# Patient Record
Sex: Female | Born: 1950 | Race: White | Hispanic: No | Marital: Married | Smoking: Never smoker
Health system: Southern US, Community
[De-identification: ages and names within clinical notes are randomized; demographics above are authoritative.]

## PROBLEM LIST (undated history)

## (undated) DIAGNOSIS — M069 Rheumatoid arthritis, unspecified: Secondary | ICD-10-CM

## (undated) DIAGNOSIS — E785 Hyperlipidemia, unspecified: Secondary | ICD-10-CM

## (undated) DIAGNOSIS — I10 Essential (primary) hypertension: Secondary | ICD-10-CM

## (undated) HISTORY — PX: TUBAL LIGATION: SHX77

## (undated) HISTORY — DX: Essential (primary) hypertension: I10

## (undated) HISTORY — DX: Hyperlipidemia, unspecified: E78.5

## (undated) HISTORY — PX: ABDOMINAL HYSTERECTOMY: SHX81

## (undated) HISTORY — PX: AUGMENTATION MAMMAPLASTY: SUR837

## (undated) HISTORY — DX: Rheumatoid arthritis, unspecified: M06.9

---

## 2017-09-30 DIAGNOSIS — Z0000 Encounter for general adult medical examination without abnormal findings: Secondary | ICD-10-CM | POA: Diagnosis not present

## 2017-09-30 DIAGNOSIS — Z79899 Other long term (current) drug therapy: Secondary | ICD-10-CM | POA: Diagnosis not present

## 2017-09-30 DIAGNOSIS — E7800 Pure hypercholesterolemia, unspecified: Secondary | ICD-10-CM | POA: Diagnosis not present

## 2018-03-31 DIAGNOSIS — Z79899 Other long term (current) drug therapy: Secondary | ICD-10-CM | POA: Diagnosis not present

## 2018-03-31 DIAGNOSIS — E7800 Pure hypercholesterolemia, unspecified: Secondary | ICD-10-CM | POA: Diagnosis not present

## 2018-04-02 DIAGNOSIS — Z79899 Other long term (current) drug therapy: Secondary | ICD-10-CM | POA: Diagnosis not present

## 2018-04-02 DIAGNOSIS — I10 Essential (primary) hypertension: Secondary | ICD-10-CM | POA: Diagnosis not present

## 2018-04-02 DIAGNOSIS — Z23 Encounter for immunization: Secondary | ICD-10-CM | POA: Diagnosis not present

## 2018-04-02 DIAGNOSIS — M25552 Pain in left hip: Secondary | ICD-10-CM | POA: Diagnosis not present

## 2018-04-02 DIAGNOSIS — E7800 Pure hypercholesterolemia, unspecified: Secondary | ICD-10-CM | POA: Diagnosis not present

## 2018-12-17 ENCOUNTER — Other Ambulatory Visit: Payer: Self-pay

## 2018-12-17 DIAGNOSIS — Z20822 Contact with and (suspected) exposure to covid-19: Secondary | ICD-10-CM

## 2018-12-17 LAB — NOVEL CORONAVIRUS, NAA: SARS-CoV-2, NAA: NOT DETECTED

## 2018-12-31 ENCOUNTER — Other Ambulatory Visit: Payer: Self-pay

## 2018-12-31 DIAGNOSIS — Z20822 Contact with and (suspected) exposure to covid-19: Secondary | ICD-10-CM

## 2019-01-01 LAB — NOVEL CORONAVIRUS, NAA: SARS-CoV-2, NAA: NOT DETECTED

## 2019-01-01 LAB — SPECIMEN STATUS REPORT

## 2019-01-27 DIAGNOSIS — M659 Synovitis and tenosynovitis, unspecified: Secondary | ICD-10-CM | POA: Diagnosis not present

## 2019-01-27 DIAGNOSIS — G5603 Carpal tunnel syndrome, bilateral upper limbs: Secondary | ICD-10-CM | POA: Diagnosis not present

## 2019-02-12 DIAGNOSIS — M25541 Pain in joints of right hand: Secondary | ICD-10-CM | POA: Diagnosis not present

## 2019-02-12 DIAGNOSIS — I10 Essential (primary) hypertension: Secondary | ICD-10-CM | POA: Diagnosis not present

## 2019-02-12 DIAGNOSIS — R609 Edema, unspecified: Secondary | ICD-10-CM | POA: Diagnosis not present

## 2019-02-12 DIAGNOSIS — Z23 Encounter for immunization: Secondary | ICD-10-CM | POA: Diagnosis not present

## 2019-02-12 DIAGNOSIS — E782 Mixed hyperlipidemia: Secondary | ICD-10-CM | POA: Diagnosis not present

## 2019-02-17 ENCOUNTER — Other Ambulatory Visit (HOSPITAL_COMMUNITY): Payer: Self-pay | Admitting: Internal Medicine

## 2019-02-17 DIAGNOSIS — Z1231 Encounter for screening mammogram for malignant neoplasm of breast: Secondary | ICD-10-CM

## 2019-02-26 ENCOUNTER — Encounter: Payer: Self-pay | Admitting: *Deleted

## 2019-02-26 ENCOUNTER — Ambulatory Visit (HOSPITAL_COMMUNITY): Payer: Self-pay

## 2019-03-02 DIAGNOSIS — M25542 Pain in joints of left hand: Secondary | ICD-10-CM | POA: Diagnosis not present

## 2019-03-02 DIAGNOSIS — R609 Edema, unspecified: Secondary | ICD-10-CM | POA: Diagnosis not present

## 2019-03-02 DIAGNOSIS — M25541 Pain in joints of right hand: Secondary | ICD-10-CM | POA: Diagnosis not present

## 2019-03-05 ENCOUNTER — Encounter (HOSPITAL_COMMUNITY): Payer: Self-pay

## 2019-03-05 ENCOUNTER — Other Ambulatory Visit: Payer: Self-pay

## 2019-03-05 ENCOUNTER — Ambulatory Visit (HOSPITAL_COMMUNITY)
Admission: RE | Admit: 2019-03-05 | Discharge: 2019-03-05 | Disposition: A | Discharge status: Home / Self Care | Payer: Medicare Other | Source: Ambulatory Visit | Attending: Internal Medicine | Admitting: Internal Medicine

## 2019-03-05 DIAGNOSIS — Z1231 Encounter for screening mammogram for malignant neoplasm of breast: Secondary | ICD-10-CM

## 2019-03-10 DIAGNOSIS — R5382 Chronic fatigue, unspecified: Secondary | ICD-10-CM | POA: Diagnosis not present

## 2019-03-10 DIAGNOSIS — M2550 Pain in unspecified joint: Secondary | ICD-10-CM | POA: Diagnosis not present

## 2019-03-24 DIAGNOSIS — M0609 Rheumatoid arthritis without rheumatoid factor, multiple sites: Secondary | ICD-10-CM | POA: Diagnosis not present

## 2019-03-24 DIAGNOSIS — E790 Hyperuricemia without signs of inflammatory arthritis and tophaceous disease: Secondary | ICD-10-CM | POA: Diagnosis not present

## 2019-03-24 DIAGNOSIS — M2550 Pain in unspecified joint: Secondary | ICD-10-CM | POA: Diagnosis not present

## 2019-03-24 DIAGNOSIS — R5382 Chronic fatigue, unspecified: Secondary | ICD-10-CM | POA: Diagnosis not present

## 2019-03-29 ENCOUNTER — Ambulatory Visit (INDEPENDENT_AMBULATORY_CARE_PROVIDER_SITE_OTHER): Payer: Self-pay | Admitting: *Deleted

## 2019-03-29 ENCOUNTER — Other Ambulatory Visit: Payer: Self-pay

## 2019-03-29 DIAGNOSIS — Z1211 Encounter for screening for malignant neoplasm of colon: Secondary | ICD-10-CM

## 2019-03-29 MED ORDER — PEG 3350-KCL-NA BICARB-NACL 420 G PO SOLR: 4000.0000 mL | Freq: Once | ORAL | 0 refills | Status: AC

## 2019-03-29 NOTE — Progress Notes (Signed)
Gastroenterology Pre-Procedure Review  Request Date: 03/29/2019 Requesting Physician: Dr. Wende Neighbors, Last TCS 5 years ago done in [place], [place], pt thinks she had polyps but none prior to then  PATIENT REVIEW QUESTIONS: The patient responded to the following health history questions as indicated:    1. Diabetes Melitis: no 2. Joint replacements in the past 12 months: no 3. Major health problems in the past 3 months: yes, RA, rheumatologist is managing her care 4. Has an artificial valve or MVP: no 5. Has a defibrillator: no 6. Has been advised in past to take antibiotics in advance of a procedure like teeth cleaning: no 7. Family history of colon cancer: yes, mother: age 36  8. Alcohol Use: yes, 1 glass of wine daily 9. Illicit drug Use: no 10. History of sleep apnea: no  11. History of coronary artery or other vascular stents placed within the last 12 months: no 12. History of any prior anesthesia complications: no 13. There is no height or weight on file to calculate BMI.ht: 5'6 wt: 170 lbs    MEDICATIONS & ALLERGIES:    Patient reports the following regarding taking any blood thinners:   Plavix? no Aspirin? no Coumadin? no Brilinta? no Xarelto? no Eliquis? no Pradaxa? no Savaysa? no Effient? no  Patient confirms/reports the following medications:  Current Outpatient Medications  Medication Sig Dispense Refill  . atorvastatin (LIPITOR) 10 MG tablet Take 10 mg by mouth daily.    . folic acid (FOLVITE) 1 MG tablet Take 1 mg by mouth daily.    . furosemide (LASIX) 20 MG tablet Take 20 mg by mouth daily.    Marland Kitchen losartan (COZAAR) 25 MG tablet Take 15 mg by mouth daily. Pt takes 15 mg daily.    . Methotrexate, PF, 25 MG/0.5ML SOAJ Inject into the skin once a week.    . nabumetone (RELAFEN) 750 MG tablet Take 750 mg by mouth 2 (two) times daily.    . Turmeric (QC TUMERIC COMPLEX PO) Take by mouth daily.    Marland Kitchen VITAMIN D PO Take by mouth daily.    Marland Kitchen VITAMIN E PO Take by mouth  daily.     No current facility-administered medications for this visit.     Patient confirms/reports the following allergies:  Allergies  Allergen Reactions  . Aspirin Other (See Comments)    Upset stomach  . Codeine Other (See Comments)    Nervous    No orders of the defined types were placed in this encounter.   AUTHORIZATION INFORMATION Primary Insurance: [employer] Medicare,  ID #: [id number],  Group #: [id number] Pre-Cert / Auth required: No, not required  SCHEDULE INFORMATION: Procedure has been scheduled as follows:  Date: 07/09/2019, Time: 10:30 Location: [facility] with Dr. Oneida Alar  This Gastroenterology Pre-Precedure Review Form is being routed to the following provider(s): Neil Crouch, PA-C

## 2019-03-29 NOTE — Patient Instructions (Signed)
SHRESHTA MEDLEY   07/08/1950 MRN: [id number]    Procedure Date: 07/09/2019 Time to register: 9:30 am Place to register: [facility] Procedure Time: 10:30 am Scheduled provider: Dr. Oneida Alar  PREPARATION FOR COLONOSCOPY WITH TRI-LYTE SPLIT PREP  Please notify us immediately if you are diabetic, take iron supplements, or if you are on Coumadin or any other blood thinners.   You will need to purchase 1 fleet enema and 1 box of Bisacodyl 28m tablets.   1 DAY BEFORE PROCEDURE:  DATE: 07/08/2019   DAY: Thursday Continue clear liquids the entire day - NO SOLID FOOD.   At 2:00 pm:  Take 2 Bisacodyl tablets.   At 4:00pm:  Start drinking your solution. Make sure you mix well per instructions on the bottle. Try to drink 1 (one) 8 ounce glass every 10-15 minutes until you have consumed HALF the jug. You should complete by 6:00pm.You must keep the left over solution refrigerated until completed next day.  Continue clear liquids. You must drink plenty of clear liquids to prevent dehyration and kidney failure.     DAY OF PROCEDURE:   DATE: 07/09/2019   DAY: Friday If you take medications for your heart, blood pressure or breathing, you may take these medications.   Five hours before your procedure time @ 5:30 am:  Finish remaining amout of bowel prep, drinking 1 (one) 8 ounce glass every 10-15 minutes until complete. You have two hours to consume remaining prep.   Three hours before your procedure time @ 7:30 am:  Nothing by mouth.   At least one hour before going to the hospital:  Give yourself one Fleet enema. You may take your morning medications with sip of water unless we have instructed otherwise.      Please see below for Dietary Information.  CLEAR LIQUIDS INCLUDE:  Water Jello (NOT red in color)   Ice Popsicles (NOT red in color)   Tea (sugar ok, no milk/cream) Powdered fruit flavored drinks  Coffee (sugar ok, no milk/cream) Gatorade/ Lemonade/ Kool-Aid  (NOT red in  color)   Juice: apple, white grape, white cranberry Soft drinks  Clear bullion, consomme, broth (fat free beef/chicken/vegetable)  Carbonated beverages (any kind)  Strained chicken noodle soup Hard Candy   Remember: Clear liquids are liquids that will allow you to see your fingers on the other side of a clear glass. Be sure liquids are NOT red in color, and not cloudy, but CLEAR.  DO NOT EAT OR DRINK ANY OF THE FOLLOWING:  Dairy products of any kind   Cranberry juice Tomato juice / V8 juice   Grapefruit juice Orange juice     Red grape juice  Do not eat any solid foods, including such foods as: cereal, oatmeal, yogurt, fruits, vegetables, creamed soups, eggs, bread, crackers, pureed foods in a blender, etc.   HELPFUL HINTS FOR DRINKING PREP SOLUTION:   Make sure prep is extremely cold. Mix and refrigerate the the morning of the prep. You may also put in the freezer.   You may try mixing some Crystal Light or Country Time Lemonade if you prefer. Mix in small amounts; add more if necessary.  Try drinking through a straw  Rinse mouth with water or a mouthwash between glasses, to remove after-taste.  Try sipping on a cold beverage /ice/ popsicles between glasses of prep.  Place a piece of sugar-free hard candy in mouth between glasses.  If you become nauseated, try consuming smaller amounts, or stretch out the  time between glasses. Stop for 30-60 minutes, then slowly start back drinking.        OTHER INSTRUCTIONS  You will need a responsible adult at least 68 years of age to accompany you and drive you home. This person must remain in the waiting room during your procedure. The hospital will cancel your procedure if you do not have a responsible adult with you.   1. Wear loose fitting clothing that is easily removed. 2. Leave jewelry and other valuables at home.  3. Remove all body piercing jewelry and leave at home. 4. Total time from sign-in until discharge is approximately  2-3 hours. 5. You should go home directly after your procedure and rest. You can resume normal activities the day after your procedure. 6. The day of your procedure you should not:  Drive  Make legal decisions  Operate machinery  Drink alcohol  Return to work   You may call the office (Dept: [phone]) before 5:00pm, or page the doctor on call [phone]) after 5:00pm, for further instructions, if necessary.   Insurance Information YOU WILL NEED TO CHECK WITH YOUR INSURANCE COMPANY FOR THE BENEFITS OF COVERAGE YOU HAVE FOR THIS PROCEDURE.  UNFORTUNATELY, NOT ALL INSURANCE COMPANIES HAVE BENEFITS TO COVER ALL OR PART OF THESE TYPES OF PROCEDURES.  IT IS YOUR RESPONSIBILITY TO CHECK YOUR BENEFITS, HOWEVER, WE WILL BE GLAD TO ASSIST YOU WITH ANY CODES YOUR INSURANCE COMPANY MAY NEED.    PLEASE NOTE THAT MOST INSURANCE COMPANIES WILL NOT COVER A SCREENING COLONOSCOPY FOR PEOPLE UNDER THE AGE OF 50  IF YOU HAVE BCBS INSURANCE, YOU MAY HAVE BENEFITS FOR A SCREENING COLONOSCOPY BUT IF POLYPS ARE FOUND THE DIAGNOSIS WILL CHANGE AND THEN YOU MAY HAVE A DEDUCTIBLE THAT WILL NEED TO BE MET. SO PLEASE MAKE SURE YOU CHECK YOUR BENEFITS FOR A SCREENING COLONOSCOPY AS WELL AS A DIAGNOSTIC COLONOSCOPY.

## 2019-03-29 NOTE — Addendum Note (Signed)
Addended by: Metro Kung on: 03/29/2019 04:32 PM   Modules accepted: Orders, SmartSet

## 2019-03-29 NOTE — Progress Notes (Signed)
Ok to schedule.

## 2019-03-30 ENCOUNTER — Telehealth: Payer: Self-pay | Admitting: *Deleted

## 2019-03-30 NOTE — Telephone Encounter (Addendum)
Pt called in and requested to cancel her procedure for 07/08/2018.  She said that she is undergoing treatment for her arthritis and just doesn't feel like doing a colonoscopy anytime soon.  Advised pt to call us back when ready to reschedule and if it is more than 6 months, to have PCP send another referral.  Pt voiced understanding.  Endo and PCP notified.

## 2019-05-03 DIAGNOSIS — M059 Rheumatoid arthritis with rheumatoid factor, unspecified: Secondary | ICD-10-CM | POA: Diagnosis not present

## 2019-05-03 DIAGNOSIS — L659 Nonscarring hair loss, unspecified: Secondary | ICD-10-CM | POA: Diagnosis not present

## 2019-05-03 DIAGNOSIS — R040 Epistaxis: Secondary | ICD-10-CM | POA: Diagnosis not present

## 2019-05-03 DIAGNOSIS — L309 Dermatitis, unspecified: Secondary | ICD-10-CM | POA: Diagnosis not present

## 2019-05-05 DIAGNOSIS — M0609 Rheumatoid arthritis without rheumatoid factor, multiple sites: Secondary | ICD-10-CM | POA: Diagnosis not present

## 2019-05-05 DIAGNOSIS — R5382 Chronic fatigue, unspecified: Secondary | ICD-10-CM | POA: Diagnosis not present

## 2019-05-05 DIAGNOSIS — M2550 Pain in unspecified joint: Secondary | ICD-10-CM | POA: Diagnosis not present

## 2019-06-08 DIAGNOSIS — M059 Rheumatoid arthritis with rheumatoid factor, unspecified: Secondary | ICD-10-CM | POA: Diagnosis not present

## 2019-06-08 DIAGNOSIS — L298 Other pruritus: Secondary | ICD-10-CM | POA: Diagnosis not present

## 2019-06-08 DIAGNOSIS — M792 Neuralgia and neuritis, unspecified: Secondary | ICD-10-CM | POA: Diagnosis not present

## 2019-06-23 ENCOUNTER — Ambulatory Visit: Payer: Medicare Other | Attending: Internal Medicine

## 2019-06-23 ENCOUNTER — Other Ambulatory Visit: Payer: Self-pay

## 2019-06-23 DIAGNOSIS — Z20822 Contact with and (suspected) exposure to covid-19: Secondary | ICD-10-CM

## 2019-06-24 LAB — NOVEL CORONAVIRUS, NAA: SARS-CoV-2, NAA: NOT DETECTED

## 2019-07-02 DIAGNOSIS — I10 Essential (primary) hypertension: Secondary | ICD-10-CM | POA: Diagnosis not present

## 2019-07-02 DIAGNOSIS — L298 Other pruritus: Secondary | ICD-10-CM | POA: Diagnosis not present

## 2019-07-02 DIAGNOSIS — E782 Mixed hyperlipidemia: Secondary | ICD-10-CM | POA: Diagnosis not present

## 2019-07-03 ENCOUNTER — Ambulatory Visit: Payer: Medicare Other | Attending: Internal Medicine

## 2019-07-03 ENCOUNTER — Other Ambulatory Visit: Payer: Self-pay

## 2019-07-03 DIAGNOSIS — Z23 Encounter for immunization: Secondary | ICD-10-CM

## 2019-07-03 NOTE — Progress Notes (Signed)
   Covid-19 Vaccination Clinic  Name:  DEONDRIA BERTON    MRN: [id number] DOB: [birth year]  07/03/2019  Ms. Beissel was observed post Covid-19 immunization for 15 minutes without incidence. She was provided with Vaccine Information Sheet and instruction to access the V-Safe system.   Ms. Siek was instructed to call 911 with any severe reactions post vaccine: Marland Kitchen Difficulty breathing  . Swelling of your face and throat  . A fast heartbeat  . A bad rash all over your body  . Dizziness and weakness    Immunizations Administered    Name Date Dose VIS Date Route   Moderna COVID-19 Vaccine 07/03/2019 12:00 PM 0.5 mL 04/27/2019 Intramuscular   Manufacturer: Moderna   Lot: [id number]   [id number]

## 2019-07-07 ENCOUNTER — Other Ambulatory Visit (HOSPITAL_COMMUNITY): Payer: Medicare Other

## 2019-07-09 ENCOUNTER — Encounter (HOSPITAL_COMMUNITY): Payer: Self-pay

## 2019-07-09 ENCOUNTER — Ambulatory Visit (HOSPITAL_COMMUNITY): Admit: 2019-07-09 | Payer: Medicare Other | Admitting: Gastroenterology

## 2019-07-09 DIAGNOSIS — Z0001 Encounter for general adult medical examination with abnormal findings: Secondary | ICD-10-CM | POA: Diagnosis not present

## 2019-07-09 DIAGNOSIS — L659 Nonscarring hair loss, unspecified: Secondary | ICD-10-CM | POA: Diagnosis not present

## 2019-07-09 DIAGNOSIS — R945 Abnormal results of liver function studies: Secondary | ICD-10-CM | POA: Diagnosis not present

## 2019-07-09 DIAGNOSIS — E790 Hyperuricemia without signs of inflammatory arthritis and tophaceous disease: Secondary | ICD-10-CM | POA: Diagnosis not present

## 2019-07-09 SURGERY — COLONOSCOPY
Anesthesia: Moderate Sedation

## 2019-08-03 ENCOUNTER — Ambulatory Visit: Payer: Medicare Other | Attending: Internal Medicine

## 2019-08-03 DIAGNOSIS — Z23 Encounter for immunization: Secondary | ICD-10-CM | POA: Insufficient documentation

## 2019-08-03 NOTE — Progress Notes (Signed)
   Covid-19 Vaccination Clinic  Name:  Karen Sims    MRN: [id number] DOB: March 04, 1951  08/03/2019  [name] was observed post Covid-19 immunization for 15 minutes without incident. She was provided with Vaccine Information Sheet and instruction to access the V-Safe system.   [name] was instructed to call 911 with any severe reactions post vaccine: Marland Kitchen Difficulty breathing  . Swelling of face and throat  . A fast heartbeat  . A bad rash all over body  . Dizziness and weakness   Immunizations Administered    Name Date Dose VIS Date Route   Moderna COVID-19 Vaccine 08/03/2019 12:01 PM 0.5 mL 04/27/2019 Intramuscular   Manufacturer: Moderna   Lot: [id number]   [id number]

## 2019-11-08 DIAGNOSIS — E782 Mixed hyperlipidemia: Secondary | ICD-10-CM | POA: Diagnosis not present

## 2019-11-08 DIAGNOSIS — E790 Hyperuricemia without signs of inflammatory arthritis and tophaceous disease: Secondary | ICD-10-CM | POA: Diagnosis not present

## 2019-11-08 DIAGNOSIS — L298 Other pruritus: Secondary | ICD-10-CM | POA: Diagnosis not present

## 2019-11-08 DIAGNOSIS — N3000 Acute cystitis without hematuria: Secondary | ICD-10-CM | POA: Diagnosis not present

## 2019-11-08 DIAGNOSIS — N390 Urinary tract infection, site not specified: Secondary | ICD-10-CM | POA: Diagnosis not present

## 2019-11-08 DIAGNOSIS — I10 Essential (primary) hypertension: Secondary | ICD-10-CM | POA: Diagnosis not present

## 2020-01-07 DIAGNOSIS — M25542 Pain in joints of left hand: Secondary | ICD-10-CM | POA: Diagnosis not present

## 2020-01-07 DIAGNOSIS — E790 Hyperuricemia without signs of inflammatory arthritis and tophaceous disease: Secondary | ICD-10-CM | POA: Diagnosis not present

## 2020-01-07 DIAGNOSIS — E559 Vitamin D deficiency, unspecified: Secondary | ICD-10-CM | POA: Diagnosis not present

## 2020-01-07 DIAGNOSIS — M25541 Pain in joints of right hand: Secondary | ICD-10-CM | POA: Diagnosis not present

## 2020-01-07 DIAGNOSIS — L659 Nonscarring hair loss, unspecified: Secondary | ICD-10-CM | POA: Diagnosis not present

## 2020-01-07 DIAGNOSIS — M059 Rheumatoid arthritis with rheumatoid factor, unspecified: Secondary | ICD-10-CM | POA: Diagnosis not present

## 2020-01-14 DIAGNOSIS — R7301 Impaired fasting glucose: Secondary | ICD-10-CM | POA: Diagnosis not present

## 2020-01-14 DIAGNOSIS — E782 Mixed hyperlipidemia: Secondary | ICD-10-CM | POA: Diagnosis not present

## 2020-01-14 DIAGNOSIS — Z23 Encounter for immunization: Secondary | ICD-10-CM | POA: Diagnosis not present

## 2020-01-14 DIAGNOSIS — R945 Abnormal results of liver function studies: Secondary | ICD-10-CM | POA: Diagnosis not present

## 2020-01-14 DIAGNOSIS — R609 Edema, unspecified: Secondary | ICD-10-CM | POA: Diagnosis not present

## 2020-01-18 ENCOUNTER — Other Ambulatory Visit (HOSPITAL_COMMUNITY): Payer: Self-pay | Admitting: Internal Medicine

## 2020-01-18 DIAGNOSIS — Z13820 Encounter for screening for osteoporosis: Secondary | ICD-10-CM

## 2020-02-21 DIAGNOSIS — M059 Rheumatoid arthritis with rheumatoid factor, unspecified: Secondary | ICD-10-CM | POA: Diagnosis not present

## 2020-02-21 DIAGNOSIS — E7849 Other hyperlipidemia: Secondary | ICD-10-CM | POA: Diagnosis not present

## 2020-02-21 DIAGNOSIS — I129 Hypertensive chronic kidney disease with stage 1 through stage 4 chronic kidney disease, or unspecified chronic kidney disease: Secondary | ICD-10-CM | POA: Diagnosis not present

## 2020-02-21 DIAGNOSIS — N183 Chronic kidney disease, stage 3 unspecified: Secondary | ICD-10-CM | POA: Diagnosis not present

## 2020-03-16 DIAGNOSIS — N183 Chronic kidney disease, stage 3 unspecified: Secondary | ICD-10-CM | POA: Diagnosis not present

## 2020-03-16 DIAGNOSIS — M059 Rheumatoid arthritis with rheumatoid factor, unspecified: Secondary | ICD-10-CM | POA: Diagnosis not present

## 2020-03-16 DIAGNOSIS — I129 Hypertensive chronic kidney disease with stage 1 through stage 4 chronic kidney disease, or unspecified chronic kidney disease: Secondary | ICD-10-CM | POA: Diagnosis not present

## 2020-03-16 DIAGNOSIS — E7849 Other hyperlipidemia: Secondary | ICD-10-CM | POA: Diagnosis not present

## 2020-04-07 DIAGNOSIS — Z23 Encounter for immunization: Secondary | ICD-10-CM | POA: Diagnosis not present

## 2020-04-13 ENCOUNTER — Ambulatory Visit: Payer: Medicare Other | Attending: Internal Medicine

## 2020-04-13 DIAGNOSIS — Z23 Encounter for immunization: Secondary | ICD-10-CM

## 2020-04-13 NOTE — Progress Notes (Signed)
   Covid-19 Vaccination Clinic  Name:  Karen Sims    MRN: [id number] DOB: 21-Feb-1951  04/13/2020  [name] was observed post Covid-19 immunization for 15 minutes without incident. She was provided with Vaccine Information Sheet and instruction to access the V-Safe system.   [name] was instructed to call 911 with any severe reactions post vaccine: Marland Kitchen Difficulty breathing  . Swelling of face and throat  . A fast heartbeat  . A bad rash all over body  . Dizziness and weakness   Immunizations Administered    No immunizations on file.

## 2020-06-02 DIAGNOSIS — M25541 Pain in joints of right hand: Secondary | ICD-10-CM | POA: Diagnosis not present

## 2020-06-02 DIAGNOSIS — Z0189 Encounter for other specified special examinations: Secondary | ICD-10-CM | POA: Diagnosis not present

## 2020-06-02 DIAGNOSIS — N1831 Chronic kidney disease, stage 3a: Secondary | ICD-10-CM | POA: Diagnosis not present

## 2020-06-02 DIAGNOSIS — R609 Edema, unspecified: Secondary | ICD-10-CM | POA: Diagnosis not present

## 2020-06-02 DIAGNOSIS — N183 Chronic kidney disease, stage 3 unspecified: Secondary | ICD-10-CM | POA: Diagnosis not present

## 2020-06-02 DIAGNOSIS — M059 Rheumatoid arthritis with rheumatoid factor, unspecified: Secondary | ICD-10-CM | POA: Diagnosis not present

## 2020-06-02 DIAGNOSIS — R040 Epistaxis: Secondary | ICD-10-CM | POA: Diagnosis not present

## 2020-06-02 DIAGNOSIS — I129 Hypertensive chronic kidney disease with stage 1 through stage 4 chronic kidney disease, or unspecified chronic kidney disease: Secondary | ICD-10-CM | POA: Diagnosis not present

## 2020-06-02 DIAGNOSIS — L659 Nonscarring hair loss, unspecified: Secondary | ICD-10-CM | POA: Diagnosis not present

## 2020-06-02 DIAGNOSIS — M25542 Pain in joints of left hand: Secondary | ICD-10-CM | POA: Diagnosis not present

## 2020-06-02 DIAGNOSIS — E559 Vitamin D deficiency, unspecified: Secondary | ICD-10-CM | POA: Diagnosis not present

## 2020-06-02 DIAGNOSIS — Z0001 Encounter for general adult medical examination with abnormal findings: Secondary | ICD-10-CM | POA: Diagnosis not present

## 2020-06-02 DIAGNOSIS — Z6827 Body mass index (BMI) 27.0-27.9, adult: Secondary | ICD-10-CM | POA: Diagnosis not present

## 2020-06-16 DIAGNOSIS — E790 Hyperuricemia without signs of inflammatory arthritis and tophaceous disease: Secondary | ICD-10-CM | POA: Diagnosis not present

## 2020-06-16 DIAGNOSIS — M0600 Rheumatoid arthritis without rheumatoid factor, unspecified site: Secondary | ICD-10-CM | POA: Diagnosis not present

## 2020-06-16 DIAGNOSIS — E782 Mixed hyperlipidemia: Secondary | ICD-10-CM | POA: Diagnosis not present

## 2020-06-16 DIAGNOSIS — Z6829 Body mass index (BMI) 29.0-29.9, adult: Secondary | ICD-10-CM | POA: Diagnosis not present

## 2020-06-16 DIAGNOSIS — R945 Abnormal results of liver function studies: Secondary | ICD-10-CM | POA: Diagnosis not present

## 2020-06-16 DIAGNOSIS — R7301 Impaired fasting glucose: Secondary | ICD-10-CM | POA: Diagnosis not present

## 2020-06-16 DIAGNOSIS — R609 Edema, unspecified: Secondary | ICD-10-CM | POA: Diagnosis not present

## 2020-06-16 DIAGNOSIS — E559 Vitamin D deficiency, unspecified: Secondary | ICD-10-CM | POA: Diagnosis not present

## 2020-06-16 DIAGNOSIS — E663 Overweight: Secondary | ICD-10-CM | POA: Diagnosis not present

## 2020-06-24 DIAGNOSIS — M792 Neuralgia and neuritis, unspecified: Secondary | ICD-10-CM | POA: Diagnosis not present

## 2020-06-24 DIAGNOSIS — L298 Other pruritus: Secondary | ICD-10-CM | POA: Diagnosis not present

## 2020-06-24 DIAGNOSIS — M059 Rheumatoid arthritis with rheumatoid factor, unspecified: Secondary | ICD-10-CM | POA: Diagnosis not present

## 2020-06-24 DIAGNOSIS — E782 Mixed hyperlipidemia: Secondary | ICD-10-CM | POA: Diagnosis not present

## 2020-06-24 DIAGNOSIS — N1831 Chronic kidney disease, stage 3a: Secondary | ICD-10-CM | POA: Diagnosis not present

## 2020-06-24 DIAGNOSIS — I10 Essential (primary) hypertension: Secondary | ICD-10-CM | POA: Diagnosis not present

## 2020-07-24 DIAGNOSIS — E782 Mixed hyperlipidemia: Secondary | ICD-10-CM | POA: Diagnosis not present

## 2020-07-24 DIAGNOSIS — N1831 Chronic kidney disease, stage 3a: Secondary | ICD-10-CM | POA: Diagnosis not present

## 2020-07-24 DIAGNOSIS — I10 Essential (primary) hypertension: Secondary | ICD-10-CM | POA: Diagnosis not present

## 2020-08-23 DIAGNOSIS — N1831 Chronic kidney disease, stage 3a: Secondary | ICD-10-CM | POA: Diagnosis not present

## 2020-08-23 DIAGNOSIS — I10 Essential (primary) hypertension: Secondary | ICD-10-CM | POA: Diagnosis not present

## 2020-08-23 DIAGNOSIS — E782 Mixed hyperlipidemia: Secondary | ICD-10-CM | POA: Diagnosis not present

## 2020-08-25 ENCOUNTER — Ambulatory Visit (HOSPITAL_COMMUNITY)
Admission: RE | Admit: 2020-08-25 | Discharge: 2020-08-25 | Disposition: A | Discharge status: Home / Self Care | Payer: Medicare HMO | Source: Ambulatory Visit | Attending: Internal Medicine | Admitting: Internal Medicine

## 2020-08-25 DIAGNOSIS — E559 Vitamin D deficiency, unspecified: Secondary | ICD-10-CM | POA: Diagnosis not present

## 2020-08-25 DIAGNOSIS — M069 Rheumatoid arthritis, unspecified: Secondary | ICD-10-CM | POA: Insufficient documentation

## 2020-08-25 DIAGNOSIS — Z13820 Encounter for screening for osteoporosis: Secondary | ICD-10-CM | POA: Insufficient documentation

## 2020-08-25 DIAGNOSIS — Z780 Asymptomatic menopausal state: Secondary | ICD-10-CM | POA: Insufficient documentation

## 2020-08-31 IMAGING — MG DIGITAL SCREENING BREAST BILAT IMPLANT W/ TOMO W/ CAD
8 of 15 series · 8 of 40 positions shown · non-contrast
Comparison: Previous exam(s).

CLINICAL DATA: Screening.

EXAM:
DIGITAL SCREENING BILATERAL MAMMOGRAM WITH IMPLANTS, CAD AND TOMO
The patient has retropectoral implants. Standard and implant
displaced views were performed.

[L CC (1 of 2)]
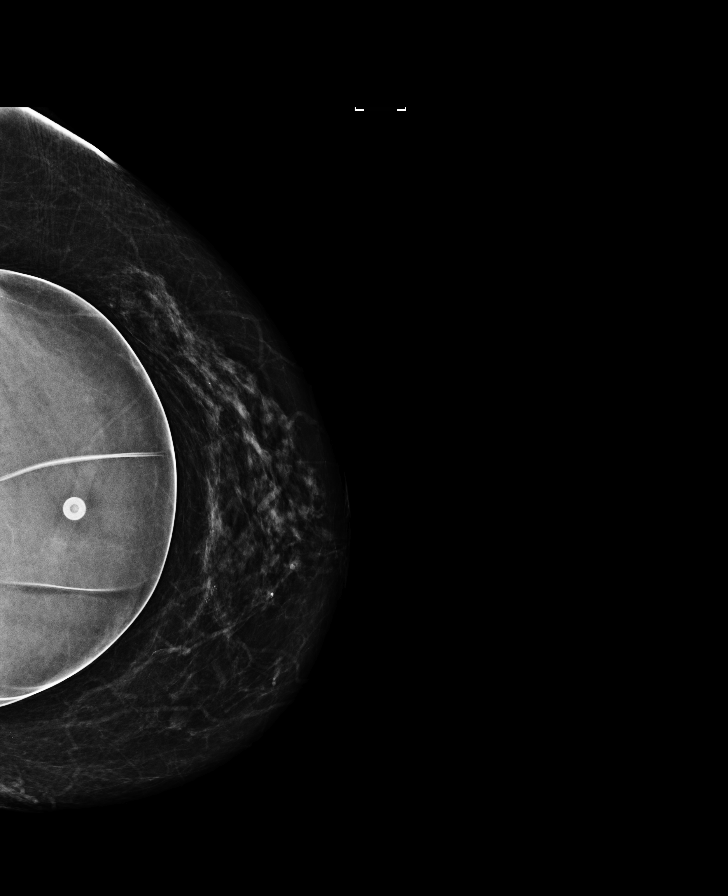

[L MLO (1 of 2)]
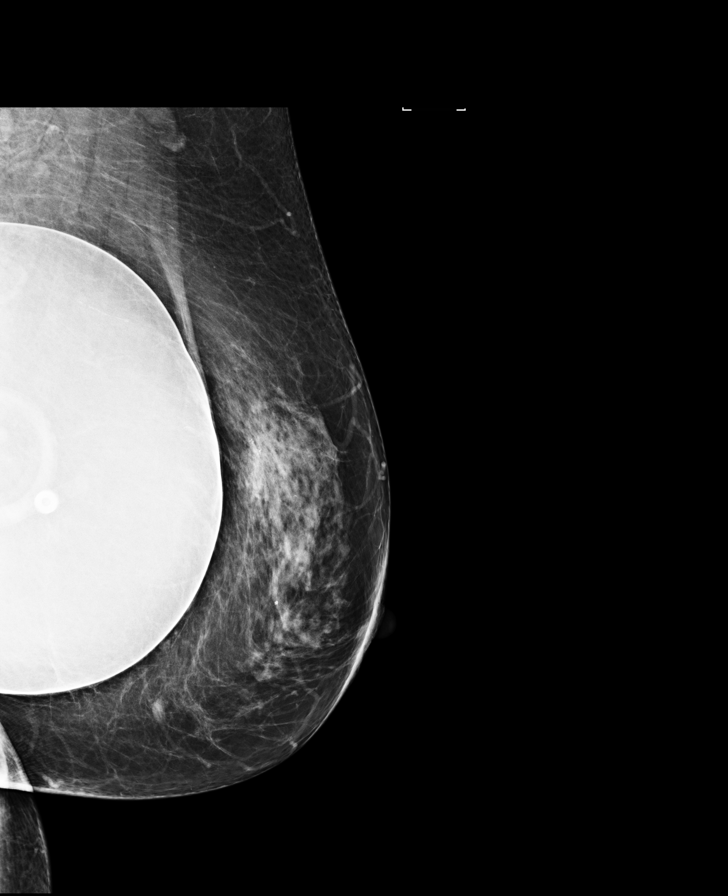

[R MLO (1 of 2)]
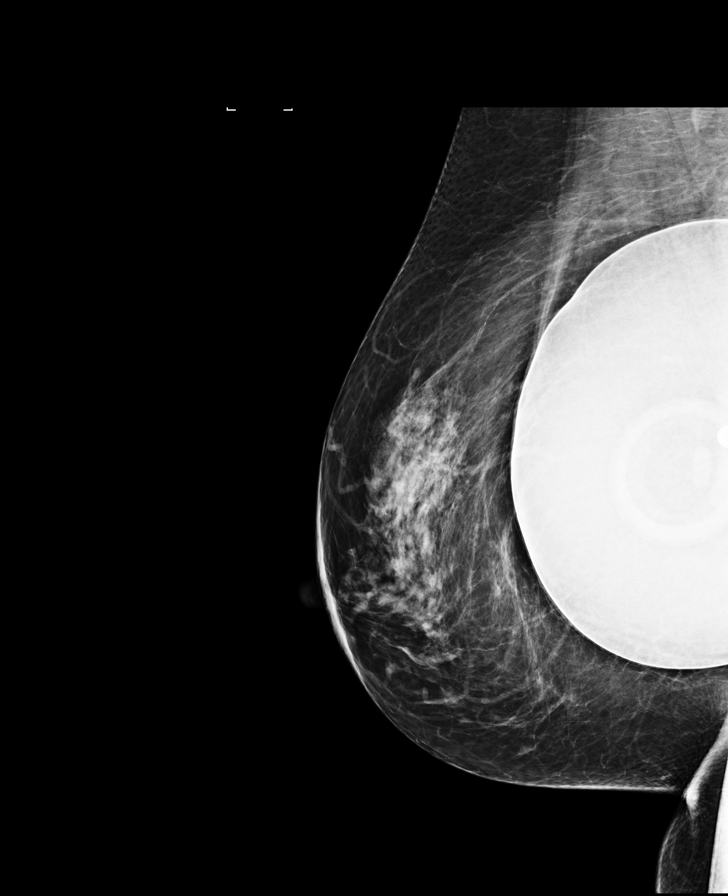

[R CC (1 of 2)]
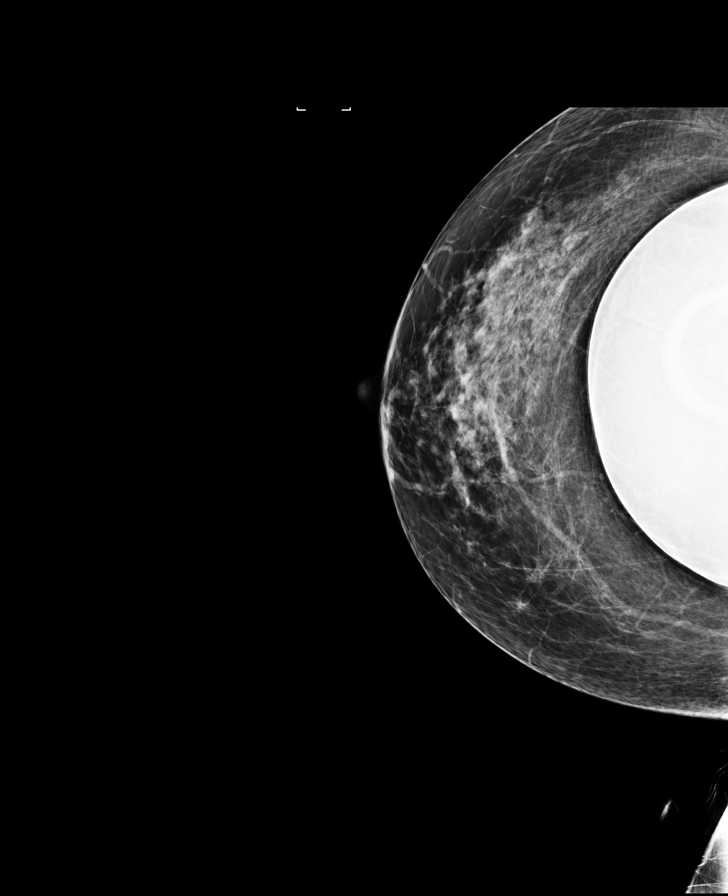

[L CC (2 of 2)]
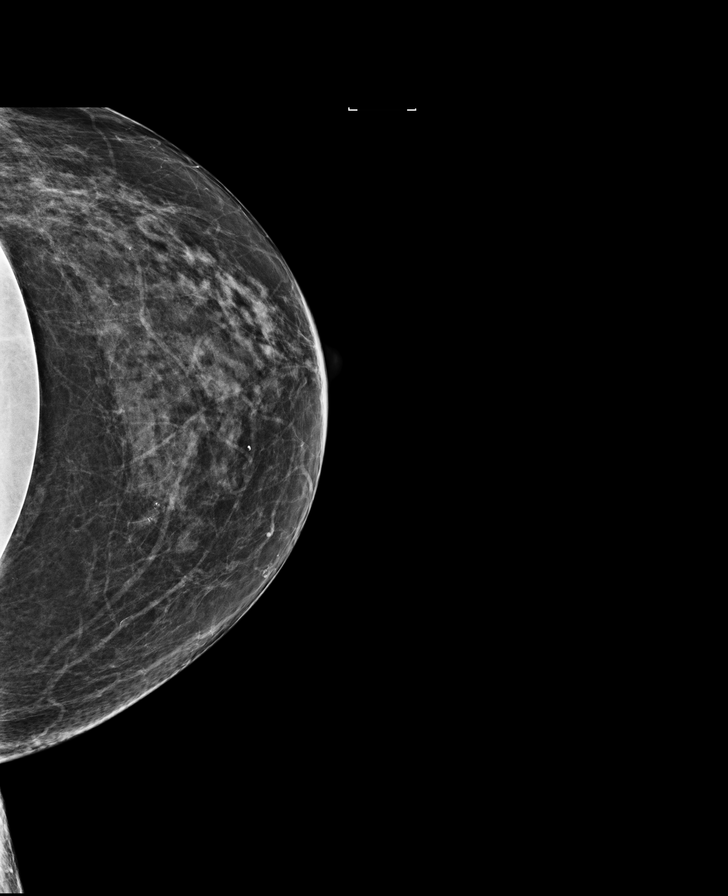

[L MLO (2 of 2)]
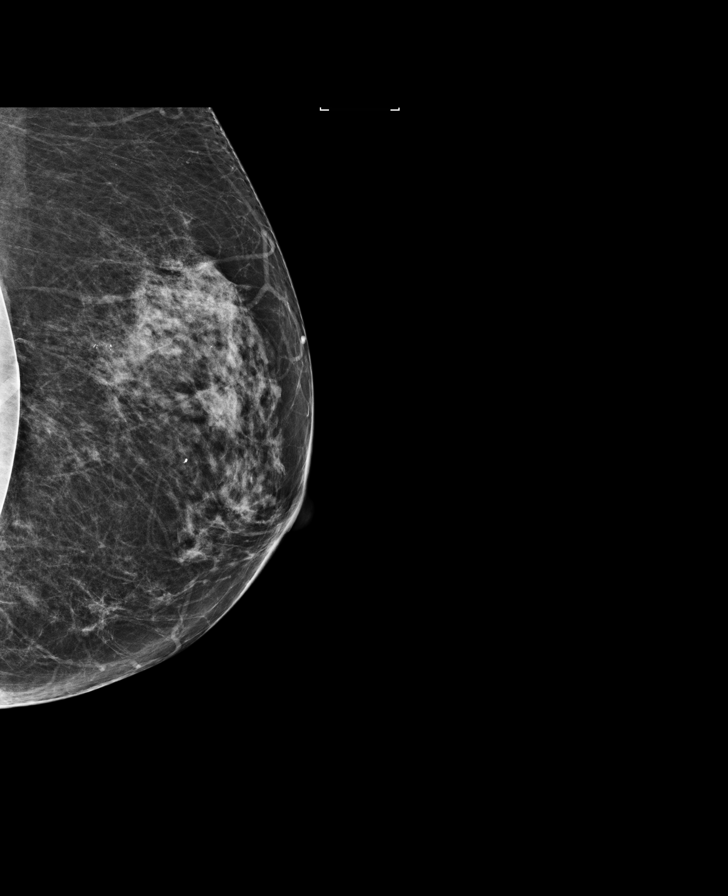

[R CC (2 of 2)]
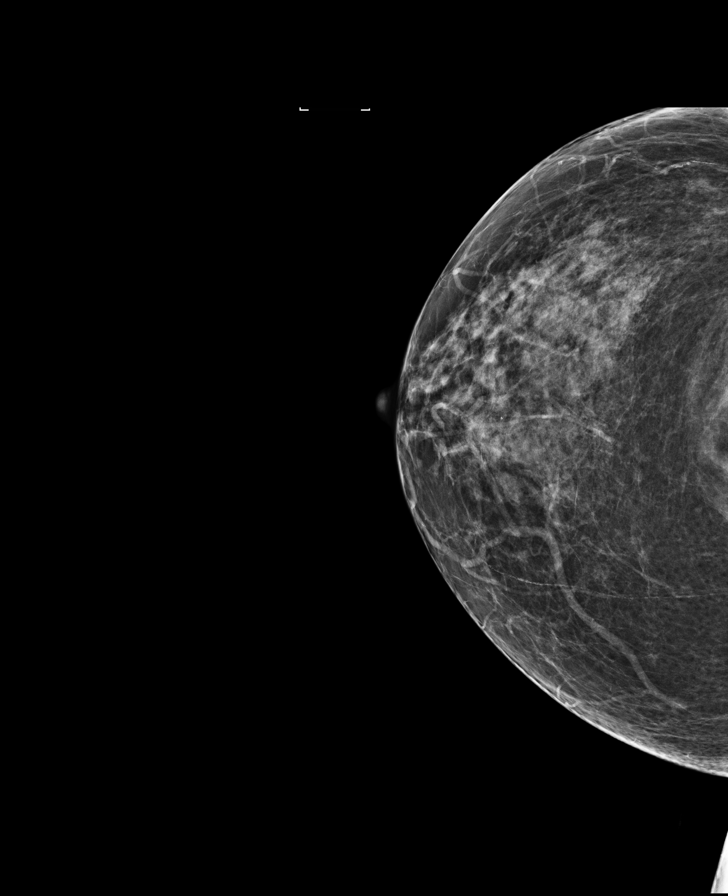

[R MLO (2 of 2)]
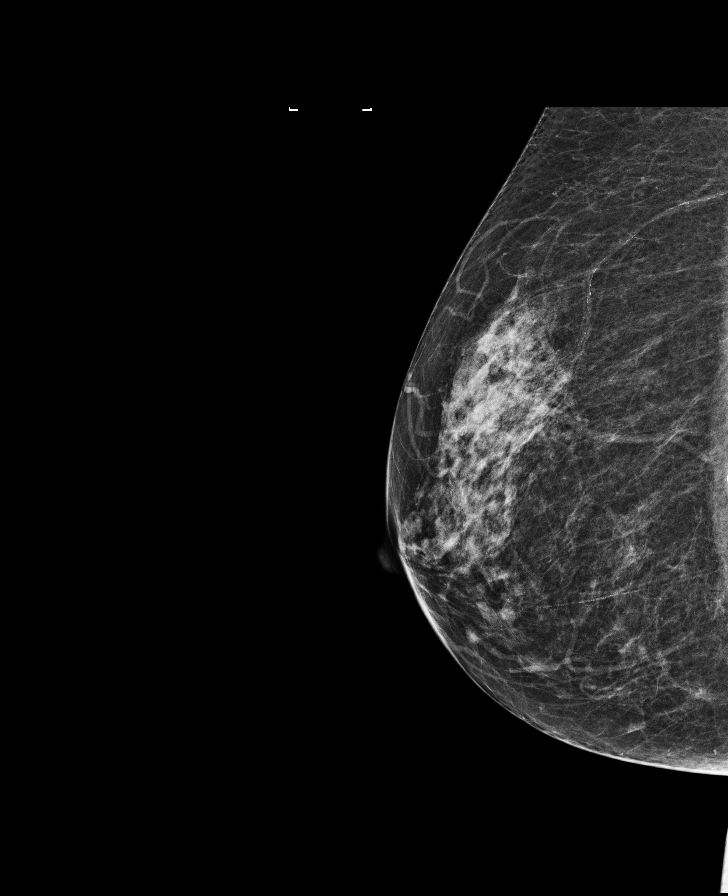

[8 of 40 positions shown; findings below may reference images not displayed]

ACR Breast Density Category c: The breast tissue is heterogeneously
dense, which may obscure small masses.
FINDINGS: There are no findings suspicious for malignancy. Images were
processed with CAD.
IMPRESSION: No mammographic evidence of malignancy. A result letter of this
screening mammogram will be mailed directly to the patient.

RECOMMENDATION:
Screening mammogram in one year. (Code:49-X-OQ9)

BI-RADS CATEGORY  1:  Negative.

## 2020-09-01 ENCOUNTER — Ambulatory Visit: Payer: Self-pay

## 2020-10-16 DIAGNOSIS — E559 Vitamin D deficiency, unspecified: Secondary | ICD-10-CM | POA: Diagnosis not present

## 2020-10-16 DIAGNOSIS — Z0189 Encounter for other specified special examinations: Secondary | ICD-10-CM | POA: Diagnosis not present

## 2020-10-16 DIAGNOSIS — I129 Hypertensive chronic kidney disease with stage 1 through stage 4 chronic kidney disease, or unspecified chronic kidney disease: Secondary | ICD-10-CM | POA: Diagnosis not present

## 2020-10-16 DIAGNOSIS — R040 Epistaxis: Secondary | ICD-10-CM | POA: Diagnosis not present

## 2020-10-16 DIAGNOSIS — N183 Chronic kidney disease, stage 3 unspecified: Secondary | ICD-10-CM | POA: Diagnosis not present

## 2020-10-16 DIAGNOSIS — M25542 Pain in joints of left hand: Secondary | ICD-10-CM | POA: Diagnosis not present

## 2020-10-16 DIAGNOSIS — Z0001 Encounter for general adult medical examination with abnormal findings: Secondary | ICD-10-CM | POA: Diagnosis not present

## 2020-10-16 DIAGNOSIS — M059 Rheumatoid arthritis with rheumatoid factor, unspecified: Secondary | ICD-10-CM | POA: Diagnosis not present

## 2020-10-16 DIAGNOSIS — N1831 Chronic kidney disease, stage 3a: Secondary | ICD-10-CM | POA: Diagnosis not present

## 2020-10-16 DIAGNOSIS — R609 Edema, unspecified: Secondary | ICD-10-CM | POA: Diagnosis not present

## 2020-10-16 DIAGNOSIS — L659 Nonscarring hair loss, unspecified: Secondary | ICD-10-CM | POA: Diagnosis not present

## 2020-10-16 DIAGNOSIS — M25541 Pain in joints of right hand: Secondary | ICD-10-CM | POA: Diagnosis not present

## 2020-10-16 DIAGNOSIS — Z6827 Body mass index (BMI) 27.0-27.9, adult: Secondary | ICD-10-CM | POA: Diagnosis not present

## 2020-10-20 DIAGNOSIS — N189 Chronic kidney disease, unspecified: Secondary | ICD-10-CM | POA: Diagnosis not present

## 2020-10-20 DIAGNOSIS — E559 Vitamin D deficiency, unspecified: Secondary | ICD-10-CM | POA: Diagnosis not present

## 2020-10-20 DIAGNOSIS — E785 Hyperlipidemia, unspecified: Secondary | ICD-10-CM | POA: Diagnosis not present

## 2020-10-20 DIAGNOSIS — I10 Essential (primary) hypertension: Secondary | ICD-10-CM | POA: Diagnosis not present

## 2020-10-20 DIAGNOSIS — R7301 Impaired fasting glucose: Secondary | ICD-10-CM | POA: Diagnosis not present

## 2020-10-20 DIAGNOSIS — M069 Rheumatoid arthritis, unspecified: Secondary | ICD-10-CM | POA: Diagnosis not present

## 2020-11-23 DIAGNOSIS — E1165 Type 2 diabetes mellitus with hyperglycemia: Secondary | ICD-10-CM | POA: Diagnosis not present

## 2020-11-23 DIAGNOSIS — I10 Essential (primary) hypertension: Secondary | ICD-10-CM | POA: Diagnosis not present

## 2020-12-24 DIAGNOSIS — E1165 Type 2 diabetes mellitus with hyperglycemia: Secondary | ICD-10-CM | POA: Diagnosis not present

## 2020-12-24 DIAGNOSIS — I10 Essential (primary) hypertension: Secondary | ICD-10-CM | POA: Diagnosis not present

## 2021-02-21 DIAGNOSIS — R7301 Impaired fasting glucose: Secondary | ICD-10-CM | POA: Diagnosis not present

## 2021-02-21 DIAGNOSIS — E559 Vitamin D deficiency, unspecified: Secondary | ICD-10-CM | POA: Diagnosis not present

## 2021-02-21 DIAGNOSIS — I10 Essential (primary) hypertension: Secondary | ICD-10-CM | POA: Diagnosis not present

## 2021-02-21 LAB — BASIC METABOLIC PANEL
BUN: 19 (ref 4–21)
CO2: 21 (ref 13–22)
Chloride: 100 (ref 99–108)
Creatinine: 1 (ref 0.5–1.1)
Glucose: 95
Potassium: 4.8 (ref 3.4–5.3)
Sodium: 140 (ref 137–147)

## 2021-02-21 LAB — COMPREHENSIVE METABOLIC PANEL
Calcium: 9.9 (ref 8.7–10.7)
Globulin: 2.6

## 2021-02-21 LAB — HEMOGLOBIN A1C: Hemoglobin A1C: 5.6

## 2021-02-21 LAB — MICROALBUMIN / CREATININE URINE RATIO: Microalb Creat Ratio: 15

## 2021-02-21 LAB — LIPID PANEL
Cholesterol: 195 (ref 0–200)
HDL: 49 (ref 35–70)
LDL Cholesterol: 114
LDl/HDL Ratio: 4
Triglycerides: 181 — AB (ref 40–160)

## 2021-02-21 LAB — HEPATIC FUNCTION PANEL
ALT: 17 (ref 7–35)
AST: 24 (ref 13–35)
Alkaline Phosphatase: 87 (ref 25–125)

## 2021-02-21 LAB — VITAMIN D 25 HYDROXY (VIT D DEFICIENCY, FRACTURES): Vit D, 25-Hydroxy: 138

## 2021-02-21 LAB — MICROALBUMIN, URINE: Microalb, Ur: 14.9

## 2021-02-23 DIAGNOSIS — E1165 Type 2 diabetes mellitus with hyperglycemia: Secondary | ICD-10-CM | POA: Diagnosis not present

## 2021-02-23 DIAGNOSIS — I10 Essential (primary) hypertension: Secondary | ICD-10-CM | POA: Diagnosis not present

## 2021-03-02 ENCOUNTER — Other Ambulatory Visit (HOSPITAL_COMMUNITY): Payer: Self-pay | Admitting: Family Medicine

## 2021-03-02 DIAGNOSIS — Z1231 Encounter for screening mammogram for malignant neoplasm of breast: Secondary | ICD-10-CM

## 2021-03-02 DIAGNOSIS — Z23 Encounter for immunization: Secondary | ICD-10-CM | POA: Diagnosis not present

## 2021-03-02 DIAGNOSIS — R609 Edema, unspecified: Secondary | ICD-10-CM | POA: Diagnosis not present

## 2021-03-02 DIAGNOSIS — I129 Hypertensive chronic kidney disease with stage 1 through stage 4 chronic kidney disease, or unspecified chronic kidney disease: Secondary | ICD-10-CM | POA: Diagnosis not present

## 2021-03-02 DIAGNOSIS — E663 Overweight: Secondary | ICD-10-CM | POA: Diagnosis not present

## 2021-03-02 DIAGNOSIS — E790 Hyperuricemia without signs of inflammatory arthritis and tophaceous disease: Secondary | ICD-10-CM | POA: Diagnosis not present

## 2021-03-02 DIAGNOSIS — E782 Mixed hyperlipidemia: Secondary | ICD-10-CM | POA: Diagnosis not present

## 2021-03-02 DIAGNOSIS — N1831 Chronic kidney disease, stage 3a: Secondary | ICD-10-CM | POA: Diagnosis not present

## 2021-03-02 DIAGNOSIS — M0600 Rheumatoid arthritis without rheumatoid factor, unspecified site: Secondary | ICD-10-CM | POA: Diagnosis not present

## 2021-03-02 DIAGNOSIS — Z6829 Body mass index (BMI) 29.0-29.9, adult: Secondary | ICD-10-CM | POA: Diagnosis not present

## 2021-03-12 ENCOUNTER — Ambulatory Visit (HOSPITAL_COMMUNITY)
Admission: RE | Admit: 2021-03-12 | Discharge: 2021-03-12 | Disposition: A | Discharge status: Home / Self Care | Payer: Medicare HMO | Source: Ambulatory Visit | Attending: Family Medicine | Admitting: Family Medicine

## 2021-03-12 ENCOUNTER — Other Ambulatory Visit: Payer: Self-pay

## 2021-03-12 DIAGNOSIS — Z1231 Encounter for screening mammogram for malignant neoplasm of breast: Secondary | ICD-10-CM | POA: Insufficient documentation

## 2021-03-26 DIAGNOSIS — I10 Essential (primary) hypertension: Secondary | ICD-10-CM | POA: Diagnosis not present

## 2021-03-26 DIAGNOSIS — E1165 Type 2 diabetes mellitus with hyperglycemia: Secondary | ICD-10-CM | POA: Diagnosis not present

## 2021-04-25 DIAGNOSIS — E1165 Type 2 diabetes mellitus with hyperglycemia: Secondary | ICD-10-CM | POA: Diagnosis not present

## 2021-04-25 DIAGNOSIS — I10 Essential (primary) hypertension: Secondary | ICD-10-CM | POA: Diagnosis not present

## 2021-06-05 ENCOUNTER — Ambulatory Visit (INDEPENDENT_AMBULATORY_CARE_PROVIDER_SITE_OTHER): Payer: Medicare Other | Admitting: Internal Medicine

## 2021-06-05 ENCOUNTER — Encounter: Payer: Self-pay | Admitting: Internal Medicine

## 2021-06-05 ENCOUNTER — Other Ambulatory Visit: Payer: Self-pay

## 2021-06-05 VITALS — BP 154/80 | HR 96 | Temp 97.7°F | Resp 16 | Ht 66.5 in | Wt 179.9 lb

## 2021-06-05 DIAGNOSIS — M069 Rheumatoid arthritis, unspecified: Secondary | ICD-10-CM | POA: Diagnosis not present

## 2021-06-05 DIAGNOSIS — R21 Rash and other nonspecific skin eruption: Secondary | ICD-10-CM

## 2021-06-05 DIAGNOSIS — I10 Essential (primary) hypertension: Secondary | ICD-10-CM | POA: Diagnosis not present

## 2021-06-05 DIAGNOSIS — Z1211 Encounter for screening for malignant neoplasm of colon: Secondary | ICD-10-CM

## 2021-06-05 DIAGNOSIS — E785 Hyperlipidemia, unspecified: Secondary | ICD-10-CM | POA: Insufficient documentation

## 2021-06-05 MED ORDER — HYDROCORTISONE 1 % EX OINT: 1.0000 "application " | TOPICAL_OINTMENT | Freq: Two times a day (BID) | CUTANEOUS | 0 refills | Status: DC

## 2021-06-05 MED ORDER — CLENPIQ 10-3.5-12 MG-GM -GM/160ML PO SOLN
1.0000 | Freq: Once | ORAL | 0 refills | Status: AC
Start: 2021-06-05 — End: 2021-06-05

## 2021-06-05 NOTE — Patient Instructions (Addendum)
It was great seeing you today!  Plan discussed at today's visit: -Please fax medical records/labs and immunization records  -Increase Losartan to 50 mg daily, can take 2 tablets of the 25 until you run out and continue to check blood pressure at home -Referral to GI placed for colonoscopy -Steroid cream sent to pharmacy for rash, if this doesn't help let me know and we'll have you see Dermatology   Follow up in: 6 months  Take care and let us know if you have any questions or concerns prior to your next visit.  Dr. Rosana Berger

## 2021-06-05 NOTE — Progress Notes (Signed)
Gastroenterology Pre-Procedure Review  Request Date: 07/06/2021  Requesting Physician: Dr. Marius Ditch  PATIENT REVIEW QUESTIONS: The patient responded to the following health history questions as indicated:    1. Are you having any GI issues? no 2. Do you have a personal history of Polyps?  Colonoscopy done 5 years ago in [place], No polyps removed. 3. Do you have a family history of Colon Cancer or Polyps? yes (Mother- colon cancer) 4. Diabetes Mellitus? no 5. Joint replacements in the past 12 months?no 6. Major health problems in the past 3 months?no 7. Any artificial heart valves, MVP, or defibrillator?no    MEDICATIONS & ALLERGIES:    Patient reports the following regarding taking any anticoagulation/antiplatelet therapy:   Plavix, Coumadin, Eliquis, Xarelto, Lovenox, Pradaxa, Brilinta, or Effient? no Aspirin? no  Patient confirms/reports the following medications:  Current Outpatient Medications  Medication Sig Dispense Refill   atorvastatin (LIPITOR) 40 MG tablet Take 1 tablet by mouth daily.     folic acid (FOLVITE) 1 MG tablet Take 1 mg by mouth daily.     hydrocortisone 1 % ointment Apply 1 application topically 2 (two) times daily. 30 g 0   losartan (COZAAR) 25 MG tablet Take 2 tablets by mouth daily.     nabumetone (RELAFEN) 750 MG tablet Take 750 mg by mouth daily.     Turmeric (QC TUMERIC COMPLEX PO) Take by mouth daily.     VITAMIN D PO Take by mouth daily.     No current facility-administered medications for this visit.    Patient confirms/reports the following allergies:  Allergies  Allergen Reactions   Methotrexate Hives   Aspirin Other (See Comments)    Upset stomach   Codeine Other (See Comments)    Nervous    No orders of the defined types were placed in this encounter.   AUTHORIZATION INFORMATION Primary Insurance: 1D#: Group #:  Secondary Insurance: 1D#: Group #:  SCHEDULE INFORMATION: Date: 07/06/2021 Time: Location: [facility]

## 2021-06-05 NOTE — Progress Notes (Signed)
New Patient Office Visit  Subjective:  Patient ID: Karen Sims, female    DOB: May 26, 1951  Age: 71 y.o. MRN: [id number]  CC:  Chief Complaint  Patient presents with   Establish Care   Rash    Red places on bilateral legs that itches    HPI Karen Sims presents as a new patient.   Chronic medical conditions include: HTN, HLD, RA. Also has an acute complaint of a rash.  RASH Duration:  3 weeks  Location: legs bilateral, worse on L > R Itching: yes Burning: no Redness: yes Oozing: no Scaling: no Blisters: no Painful: no Fevers: no Change in detergents/soaps/personal care products: no Recent illness: no Recent travel:no History of same: no Context: fluctuating Alleviating factors:  not wearing long pants/socks seems to make the rash better Treatments attempted:benadryl and lotion/moisturizer  Hypertension: -Medications: Losartan 25 mg  -Patient is compliant with above medications and reports no side effects. -Checking BP at home (average): 140/90 usually at home  -Denies any SOB, CP, vision changes, LE edema or symptoms of hypotension  HLD: -Medications: Lipitor 40 -Patient is compliant with above medications and reports no side effects.  -Last lipid panel: 8/20 - TC 164, triglcyerides 98, HDL 45, LDL 99  RA: -Currently on Relafen  -Diagnosed 3 years ago, seen Rheumatology in the past but not now -Had been on Methotrexate, severe side effects. Now not on any medications besides steroids as needed which she hasn't needed for the last 18 months  Health Maintenance: -Blood work within last 6 months, will fax records -Breast cancer screening: Mammogram 10/22 Birads 1 -Colon cancer screening: last colonoscopy 6-7 years ago, recommend repeat in 5 years  Past Medical History:  Diagnosis Date   Hyperlipidemia    Hypertension    RA (rheumatoid arthritis) ([place])     Past Surgical History:  Procedure Laterality Date   ABDOMINAL HYSTERECTOMY      AUGMENTATION MAMMAPLASTY     2002   TUBAL LIGATION      Family History  Problem Relation Age of Onset   Cancer Mother        colon   Hypertension Father    Hyperlipidemia Father    Stroke Father    Kidney disease Father     Social History   Socioeconomic History   Marital status: Married    Spouse name: Not on file   Number of children: Not on file   Years of education: Not on file   Highest education level: Not on file  Occupational History   Not on file  Tobacco Use   Smoking status: Never   Smokeless tobacco: Never  Vaping Use   Vaping Use: Never used  Substance and Sexual Activity   Alcohol use: Yes    Alcohol/week: 6.0 standard drinks    Types: 6 Glasses of wine per week   Drug use: Never   Sexual activity: Not Currently  Other Topics Concern   Not on file  Social History Narrative   Not on file   Social Determinants of Health   Financial Resource Strain: Not on file  Food Insecurity: Not on file  Transportation Needs: Not on file  Physical Activity: Not on file  Stress: Not on file  Social Connections: Not on file  Intimate Partner Violence: Not on file    ROS Review of Systems  Constitutional:  Negative for chills and fever.  Eyes:  Negative for visual disturbance.  Respiratory:  Negative for cough and shortness  of breath.   Cardiovascular:  Negative for chest pain.  Gastrointestinal:  Negative for abdominal pain.  Skin:  Positive for rash.  Neurological:  Negative for dizziness and headaches.   Objective:   Today's Vitals: BP (!) 154/80    Pulse 96    Temp 97.7 F (36.5 C)    Resp 16    Ht 5' 6.5" (1.689 m)    Wt 179 lb 14.4 oz (81.6 kg)    SpO2 99%    BMI 28.60 kg/m   Physical Exam Constitutional:      Appearance: Normal appearance.  HENT:     Head: Normocephalic and atraumatic.  Eyes:     Conjunctiva/sclera: Conjunctivae normal.  Cardiovascular:     Rate and Rhythm: Normal rate and regular rhythm.  Pulmonary:     Effort:  Pulmonary effort is normal.     Breath sounds: Normal breath sounds.  Musculoskeletal:     Right lower leg: No edema.     Left lower leg: No edema.  Skin:    General: Skin is warm and dry.     Comments: Macular erythematous patchy rash on lower calves  Neurological:     General: No focal deficit present.     Mental Status: She is alert. Mental status is at baseline.  Psychiatric:        Mood and Affect: Mood normal.        Behavior: Behavior normal.    Assessment & Plan:   1. Hypertension, unspecified type: BP elevated here today, 154/80, 145/85 on recheck. BP averaging about 140/90 at home as well, increase Losartan dose to 50 mg daily. She will continue to check her BP at home and bring to follow up appointment.   2. Hyperlipidemia, unspecified hyperlipidemia type: Stable, she will fax over most recent blood work results for review. Continue statin.  3. Rheumatoid arthritis involving multiple sites, unspecified whether rheumatoid factor present ([place]): Stable, no issues currently. Not on current medications.   4. Rash: Uncertain etiology, we will try a steroid cream but if this does not resolve I would like her to see Dermatology with her history of auto-immune disease.  - hydrocortisone 1 % ointment; Apply 1 application topically 2 (two) times daily.  Dispense: 30 g; Refill: 0  5. Screening for colon cancer: Colonoscopy ordered today.  - Ambulatory referral to Gastroenterology   Follow-up: Return in about 6 months (around 12/03/2021).   Teodora Medici, DO

## 2021-06-14 ENCOUNTER — Other Ambulatory Visit: Payer: Self-pay

## 2021-06-14 MED ORDER — ATORVASTATIN CALCIUM 40 MG PO TABS
40.0000 mg | ORAL_TABLET | Freq: Every day | ORAL | 1 refills | Status: DC
Start: 2021-06-14 — End: 2022-05-30

## 2021-06-14 MED ORDER — LOSARTAN POTASSIUM 25 MG PO TABS: 50.0000 mg | ORAL_TABLET | Freq: Every day | ORAL | 1 refills | Status: DC

## 2021-06-14 MED ORDER — NABUMETONE 750 MG PO TABS
750.0000 mg | ORAL_TABLET | Freq: Every day | ORAL | 1 refills | Status: DC
Start: 2021-06-14 — End: 2022-10-02

## 2021-07-03 ENCOUNTER — Telehealth: Payer: Self-pay

## 2021-07-03 NOTE — Telephone Encounter (Signed)
Patient wants to cancel her colonoscopy for 07/06/21. Called patient back to see if she wanted to reschedule her procedure but left a message for call back. Called trish and cancel the procedure.

## 2021-07-06 ENCOUNTER — Ambulatory Visit: Admission: RE | Admit: 2021-07-06 | Payer: Medicare Other | Source: Home / Self Care | Admitting: Gastroenterology

## 2021-07-06 ENCOUNTER — Encounter: Admission: RE | Payer: Self-pay | Source: Home / Self Care

## 2021-07-06 SURGERY — COLONOSCOPY WITH PROPOFOL
Anesthesia: General

## 2021-08-07 ENCOUNTER — Ambulatory Visit: Payer: Medicare Other

## 2021-08-13 DIAGNOSIS — I872 Venous insufficiency (chronic) (peripheral): Secondary | ICD-10-CM | POA: Diagnosis not present

## 2021-10-18 ENCOUNTER — Ambulatory Visit: Payer: Medicare Other

## 2021-12-05 NOTE — Progress Notes (Unsigned)
New Patient Office Visit  Subjective:  Patient ID: Karen Sims, female    DOB: May 03, 1951  Age: 71 y.o. MRN: [id number]  CC:  No chief complaint on file.   HPI Karen Sims presents as a new patient.   Chronic medical conditions include: HTN, HLD, RA.  Hypertension: -Medications: Losartan 50 mg, increased at last office visit -Patient is compliant with above medications and reports no side effects. -Checking BP at home (average): 140/90 usually at home  -Denies any SOB, CP, vision changes, LE edema or symptoms of hypotension  HLD: -Medications: Lipitor 40 mg -Patient is compliant with above medications and reports no side effects.  -Last lipid panel: Lipid Panel     Component Value Date/Time   CHOL 195 02/21/2021 0000   TRIG 181 (A) 02/21/2021 0000   HDL 49 02/21/2021 0000   LDLCALC 114 02/21/2021 0000   RA: -Currently on Relafen  -Diagnosed 3 years ago, seen Rheumatology in the past but not now -Had been on Methotrexate, severe side effects. Now not on any medications besides steroids as needed which she hasn't needed for the last 18 months  Health Maintenance: -Blood work up-to-date -Breast cancer screening: Mammogram 10/22 Birads 1 -Colon cancer screening: last colonoscopy 6-7 years ago, recommend repeat in 5 years  Past Medical History:  Diagnosis Date   Hyperlipidemia    Hypertension    RA (rheumatoid arthritis) ([place])     Past Surgical History:  Procedure Laterality Date   ABDOMINAL HYSTERECTOMY     AUGMENTATION MAMMAPLASTY     2002   TUBAL LIGATION      Family History  Problem Relation Age of Onset   Cancer Mother        colon   Hypertension Father    Hyperlipidemia Father    Stroke Father    Kidney disease Father     Social History   Socioeconomic History   Marital status: Married    Spouse name: Not on file   Number of children: Not on file   Years of education: Not on file   Highest education level: Not on file   Occupational History   Not on file  Tobacco Use   Smoking status: Never   Smokeless tobacco: Never  Vaping Use   Vaping Use: Never used  Substance and Sexual Activity   Alcohol use: Yes    Alcohol/week: 6.0 standard drinks of alcohol    Types: 6 Glasses of wine per week   Drug use: Never   Sexual activity: Not Currently  Other Topics Concern   Not on file  Social History Narrative   Not on file   Social Determinants of Health   Financial Resource Strain: Not on file  Food Insecurity: Not on file  Transportation Needs: Not on file  Physical Activity: Not on file  Stress: Not on file  Social Connections: Not on file  Intimate Partner Violence: Not on file    ROS Review of Systems  Constitutional:  Negative for chills and fever.  Eyes:  Negative for visual disturbance.  Respiratory:  Negative for cough and shortness of breath.   Cardiovascular:  Negative for chest pain.  Gastrointestinal:  Negative for abdominal pain.  Skin:  Positive for rash.  Neurological:  Negative for dizziness and headaches.    Objective:   Today's Vitals: There were no vitals taken for this visit.  Physical Exam Constitutional:      Appearance: Normal appearance.  HENT:     Head:  Normocephalic and atraumatic.  Eyes:     Conjunctiva/sclera: Conjunctivae normal.  Cardiovascular:     Rate and Rhythm: Normal rate and regular rhythm.  Pulmonary:     Effort: Pulmonary effort is normal.     Breath sounds: Normal breath sounds.  Musculoskeletal:     Right lower leg: No edema.     Left lower leg: No edema.  Skin:    General: Skin is warm and dry.     Comments: Macular erythematous patchy rash on lower calves  Neurological:     General: No focal deficit present.     Mental Status: She is alert. Mental status is at baseline.  Psychiatric:        Mood and Affect: Mood normal.        Behavior: Behavior normal.     Assessment & Plan:   1. Hypertension, unspecified type: BP elevated  here today, 154/80, 145/85 on recheck. BP averaging about 140/90 at home as well, increase Losartan dose to 50 mg daily. She will continue to check her BP at home and bring to follow up appointment.   2. Hyperlipidemia, unspecified hyperlipidemia type: Stable, she will fax over most recent blood work results for review. Continue statin.  3. Rheumatoid arthritis involving multiple sites, unspecified whether rheumatoid factor present ([place]): Stable, no issues currently. Not on current medications.   4. Rash: Uncertain etiology, we will try a steroid cream but if this does not resolve I would like her to see Dermatology with her history of auto-immune disease.  - hydrocortisone 1 % ointment; Apply 1 application topically 2 (two) times daily.  Dispense: 30 g; Refill: 0  5. Screening for colon cancer: Colonoscopy ordered today.  - Ambulatory referral to Gastroenterology   Follow-up: No follow-ups on file.   Teodora Medici, DO

## 2021-12-06 ENCOUNTER — Encounter: Payer: Self-pay | Admitting: Internal Medicine

## 2021-12-06 ENCOUNTER — Ambulatory Visit (INDEPENDENT_AMBULATORY_CARE_PROVIDER_SITE_OTHER): Payer: Medicare Other | Admitting: Internal Medicine

## 2021-12-06 VITALS — BP 162/90 | HR 84 | Temp 98.2°F | Resp 16 | Ht 66.5 in | Wt 183.9 lb

## 2021-12-06 DIAGNOSIS — Z23 Encounter for immunization: Secondary | ICD-10-CM | POA: Diagnosis not present

## 2021-12-06 DIAGNOSIS — I10 Essential (primary) hypertension: Secondary | ICD-10-CM

## 2021-12-06 DIAGNOSIS — E785 Hyperlipidemia, unspecified: Secondary | ICD-10-CM | POA: Diagnosis not present

## 2021-12-06 DIAGNOSIS — Z1211 Encounter for screening for malignant neoplasm of colon: Secondary | ICD-10-CM

## 2021-12-06 DIAGNOSIS — M069 Rheumatoid arthritis, unspecified: Secondary | ICD-10-CM

## 2021-12-06 MED ORDER — HYDROCHLOROTHIAZIDE 12.5 MG PO CAPS: 12.5000 mg | ORAL_CAPSULE | Freq: Every day | ORAL | 1 refills | Status: DC

## 2021-12-06 NOTE — Patient Instructions (Addendum)
It was great seeing you today!  Plan discussed at today's visit: -Pneumonia vaccine given today  -Referral placed to GI for colonoscopy -Stop Losartan, start HCTZ 12.5 mg  -Continue to monitor blood pressure at home, please let me know if BP consistently >140/90  Follow up in: 1 month   Take care and let us know if you have any questions or concerns prior to your next visit.  Dr. Rosana Berger

## 2021-12-07 ENCOUNTER — Telehealth: Payer: Self-pay

## 2021-12-07 DIAGNOSIS — Z800 Family history of malignant neoplasm of digestive organs: Secondary | ICD-10-CM

## 2021-12-07 DIAGNOSIS — Z1211 Encounter for screening for malignant neoplasm of colon: Secondary | ICD-10-CM

## 2021-12-07 NOTE — Telephone Encounter (Signed)
Gastroenterology Pre-Procedure Review  Request Date: TBD (Discussed bowel preps.  Her insurance covers Nulytely bowel prep not ClenPiq, SuTab or Suprep.  She said she would like to think about this some more before scheduling.) Requesting Physician: Dr. Marius Ditch  PATIENT REVIEW QUESTIONS: The patient responded to the following health history questions as indicated:    1. Are you having any GI issues? no 2. Do you have a personal history of Polyps? no 3. Do you have a family history of Colon Cancer or Polyps? yes (mother colon cancer) 4. Diabetes Mellitus? no 5. Joint replacements in the past 12 months?no 6. Major health problems in the past 3 months?no 7. Any artificial heart valves, MVP, or defibrillator?no    MEDICATIONS & ALLERGIES:    Patient reports the following regarding taking any anticoagulation/antiplatelet therapy:   Plavix, Coumadin, Eliquis, Xarelto, Lovenox, Pradaxa, Brilinta, or Effient? no Aspirin? no  Patient confirms/reports the following medications:  Current Outpatient Medications  Medication Sig Dispense Refill   atorvastatin (LIPITOR) 40 MG tablet Take 1 tablet (40 mg total) by mouth daily. 90 tablet 1   folic acid (FOLVITE) 1 MG tablet Take 1 mg by mouth daily.     hydrochlorothiazide (MICROZIDE) 12.5 MG capsule Take 1 capsule (12.5 mg total) by mouth daily. 30 capsule 1   nabumetone (RELAFEN) 750 MG tablet Take 1 tablet (750 mg total) by mouth daily. 90 tablet 1   Turmeric (QC TUMERIC COMPLEX PO) Take by mouth daily.     VITAMIN D PO Take by mouth daily.     No current facility-administered medications for this visit.    Patient confirms/reports the following allergies:  Allergies  Allergen Reactions   Methotrexate Hives   Aspirin Other (See Comments)    Upset stomach   Codeine Other (See Comments)    Nervous    No orders of the defined types were placed in this encounter.   AUTHORIZATION INFORMATION Primary Insurance: 1D#: Group #:  Secondary  Insurance: 1D#: Group #:  SCHEDULE INFORMATION: Date: TBD Time: Location: [place]

## 2022-01-10 ENCOUNTER — Ambulatory Visit (INDEPENDENT_AMBULATORY_CARE_PROVIDER_SITE_OTHER): Payer: Medicare Other | Admitting: Internal Medicine

## 2022-01-10 ENCOUNTER — Encounter: Payer: Self-pay | Admitting: Internal Medicine

## 2022-01-10 VITALS — BP 156/72 | HR 73 | Resp 16 | Ht 66.0 in | Wt 180.0 lb

## 2022-01-10 DIAGNOSIS — R5383 Other fatigue: Secondary | ICD-10-CM | POA: Diagnosis not present

## 2022-01-10 DIAGNOSIS — E785 Hyperlipidemia, unspecified: Secondary | ICD-10-CM

## 2022-01-10 DIAGNOSIS — Z1159 Encounter for screening for other viral diseases: Secondary | ICD-10-CM

## 2022-01-10 DIAGNOSIS — I10 Essential (primary) hypertension: Secondary | ICD-10-CM

## 2022-01-10 DIAGNOSIS — Z1231 Encounter for screening mammogram for malignant neoplasm of breast: Secondary | ICD-10-CM

## 2022-01-10 MED ORDER — HYDROCHLOROTHIAZIDE 25 MG PO TABS: 25.0000 mg | ORAL_TABLET | Freq: Every day | ORAL | 1 refills | Status: DC

## 2022-01-10 NOTE — Progress Notes (Signed)
Established Patient Office Visit  Subjective:  Patient ID: Karen Sims, female    DOB: 1950-09-04  Age: 71 y.o. MRN: [id number]  CC:  Chief Complaint  Patient presents with   Follow-up    HPI Karen Sims presents for follow up.   Chronic medical conditions include: HTN, HLD, RA.  Hypertension: -Medications: HCTZ 12.5 mg started at LOV -Failed Meds: Losartan - saw Dermatology in the  meantime who thinks her rash on her legs is due to the Losartan, Lisinopril caused cough -Patient is compliant with above medications and reports no side effects. -Checking BP at home (average): 130-164/68-88 -Denies any SOB, CP, vision changes, LE edema or symptoms of hypotension. Occasional headaches when BP is high, had one a few weeks ago.   HLD: -Medications: Lipitor 40 mg -Patient is compliant with above medications and reports no side effects.  -Last lipid panel: Lipid Panel     Component Value Date/Time   CHOL 195 02/21/2021 0000   TRIG 181 (A) 02/21/2021 0000   HDL 49 02/21/2021 0000   LDLCALC 114 02/21/2021 0000   RA: -Currently on Relafen  -Diagnosed in 2020, seen by [facility] Rheumatology in the past but not now -Had been on Methotrexate, severe side effects. Now not on any medications besides steroids as needed which she hasn't needed it in the last 2022.   Health Maintenance: -Blood work due -Breast cancer screening: Mammogram 10/22 Birads 1 -Colon cancer screening: last colonoscopy 6-7 years ago, recommend repeat in 5 years, due   Past Medical History:  Diagnosis Date   Hyperlipidemia    Hypertension    RA (rheumatoid arthritis) ([place])     Past Surgical History:  Procedure Laterality Date   ABDOMINAL HYSTERECTOMY     AUGMENTATION MAMMAPLASTY     2002   TUBAL LIGATION      Family History  Problem Relation Age of Onset   Cancer Mother        colon   Hypertension Father    Hyperlipidemia Father    Stroke Father    Kidney disease Father      Social History   Socioeconomic History   Marital status: Married    Spouse name: Not on file   Number of children: Not on file   Years of education: Not on file   Highest education level: Not on file  Occupational History   Not on file  Tobacco Use   Smoking status: Never   Smokeless tobacco: Never  Vaping Use   Vaping Use: Never used  Substance and Sexual Activity   Alcohol use: Yes    Alcohol/week: 6.0 standard drinks of alcohol    Types: 6 Glasses of wine per week   Drug use: Never   Sexual activity: Not Currently  Other Topics Concern   Not on file  Social History Narrative   Not on file   Social Determinants of Health   Financial Resource Strain: Not on file  Food Insecurity: Not on file  Transportation Needs: Not on file  Physical Activity: Not on file  Stress: Not on file  Social Connections: Not on file  Intimate Partner Violence: Not on file    ROS Review of Systems  Constitutional:  Negative for chills and fever.  Eyes:  Negative for visual disturbance.  Respiratory:  Negative for cough and shortness of breath.   Cardiovascular:  Negative for chest pain.  Gastrointestinal:  Negative for abdominal pain.  Skin:  Positive for rash.  Neurological:  Negative for dizziness.    Objective:   Today's Vitals: BP (!) 154/68   Pulse 73   Resp 16   Ht '5\' 6"'$  (1.676 m)   Wt 180 lb (81.6 kg)   BMI 29.05 kg/m   Physical Exam Constitutional:      Appearance: Normal appearance.  HENT:     Head: Normocephalic and atraumatic.  Eyes:     Conjunctiva/sclera: Conjunctivae normal.  Cardiovascular:     Rate and Rhythm: Normal rate and regular rhythm.  Pulmonary:     Effort: Pulmonary effort is normal.     Breath sounds: Normal breath sounds.  Musculoskeletal:     Right lower leg: No edema.     Left lower leg: No edema.  Skin:    General: Skin is warm and dry.     Comments: Macular erythematous patchy rash on lower calves, unchanged  Neurological:      General: No focal deficit present.     Mental Status: She is alert. Mental status is at baseline.  Psychiatric:        Mood and Affect: Mood normal.        Behavior: Behavior normal.     Assessment & Plan:   1. Hypertension, unspecified type: Blood pressure still uncontrolled, will increase HCTZ to 25 mg daily. Continue to monitor at home and follow up in 1 month to recheck. Annual labs due, obtain CBC, CMP today.   - CBC w/Diff/Platelet - COMPLETE METABOLIC PANEL WITH GFR - hydrochlorothiazide (HYDRODIURIL) 25 MG tablet; Take 1 tablet (25 mg total) by mouth daily.  Dispense: 30 tablet; Refill: 1  2. Hyperlipidemia, unspecified hyperlipidemia type: Recheck lipid panel today, continue Lipitor 40 mg daily.   - Lipid Profile  3. Need for hepatitis C screening test: Screening due.   - Hepatitis C Antibody  4. Other fatigue: Check TSH today, mother had history of thyroid cancer that required surgical removal.   - TSH  5. Encounter for screening mammogram for malignant neoplasm of breast: Mammogram ordered today.   - MM Digital Screening; Future   Follow-up: Return in about 4 weeks (around 02/07/2022).   Teodora Medici, DO

## 2022-01-10 NOTE — Patient Instructions (Addendum)
It was great seeing you today!  Plan discussed at today's visit: -Blood work ordered today, results will be uploaded to MyChart.  -Increase HCTZ to 25 mg daily  -Continue to monitor blood pressure at home -Mammogram ordered to schedule in the fall  Follow up in: 1 month   Take care and let us know if you have any questions or concerns prior to your next visit.  Dr. Rosana Berger

## 2022-01-11 LAB — COMPLETE METABOLIC PANEL WITH GFR
AG Ratio: 1.6 (calc) (ref 1.0–2.5)
ALT: 20 U/L (ref 6–29)
AST: 20 U/L (ref 10–35)
Albumin: 4.5 g/dL (ref 3.6–5.1)
Alkaline phosphatase (APISO): 99 U/L (ref 37–153)
BUN/Creatinine Ratio: 19 (calc) (ref 6–22)
BUN: 23 mg/dL (ref 7–25)
CO2: 30 mmol/L (ref 20–32)
Calcium: 10 mg/dL (ref 8.6–10.4)
Chloride: 101 mmol/L (ref 98–110)
Creat: 1.22 mg/dL — ABNORMAL HIGH (ref 0.60–1.00)
Globulin: 2.9 g/dL (calc) (ref 1.9–3.7)
Glucose, Bld: 99 mg/dL (ref 65–99)
Potassium: 4.9 mmol/L (ref 3.5–5.3)
Sodium: 141 mmol/L (ref 135–146)
Total Bilirubin: 0.4 mg/dL (ref 0.2–1.2)
Total Protein: 7.4 g/dL (ref 6.1–8.1)
eGFR: 48 mL/min/{1.73_m2} — ABNORMAL LOW (ref >=60)

## 2022-01-11 LAB — CBC WITH DIFFERENTIAL/PLATELET
Absolute Monocytes: 540 cells/uL (ref 200–950)
Basophils Absolute: 78 cells/uL (ref 0–200)
Basophils Relative: 1.2 %
Eosinophils Absolute: 202 cells/uL (ref 15–500)
Eosinophils Relative: 3.1 %
HCT: 36.3 % (ref 35.0–45.0)
Hemoglobin: 12.3 g/dL (ref 11.7–15.5)
Lymphs Abs: 1482 cells/uL (ref 850–3900)
MCH: 31 pg (ref 27.0–33.0)
MCHC: 33.9 g/dL (ref 32.0–36.0)
MCV: 91.4 fL (ref 80.0–100.0)
MPV: 10.2 fL (ref 7.5–12.5)
Monocytes Relative: 8.3 %
Neutro Abs: 4199 cells/uL (ref 1500–7800)
Neutrophils Relative %: 64.6 %
Platelets: 266 10*3/uL (ref 140–400)
RBC: 3.97 10*6/uL (ref 3.80–5.10)
RDW: 12.4 % (ref 11.0–15.0)
Total Lymphocyte: 22.8 %
WBC: 6.5 10*3/uL (ref 3.8–10.8)

## 2022-01-11 LAB — LIPID PANEL
Cholesterol: 195 mg/dL (ref <200)
HDL: 43 mg/dL — ABNORMAL LOW (ref >=50)
LDL Cholesterol (Calc): 113 mg/dL (calc) — ABNORMAL HIGH
Non-HDL Cholesterol (Calc): 152 mg/dL (calc) — ABNORMAL HIGH (ref <130)
Total CHOL/HDL Ratio: 4.5 (calc) (ref <5.0)
Triglycerides: 275 mg/dL — ABNORMAL HIGH (ref <150)

## 2022-01-11 LAB — TSH: TSH: 1.47 mIU/L (ref 0.40–4.50)

## 2022-01-11 LAB — HEPATITIS C ANTIBODY: Hepatitis C Ab: NONREACTIVE

## 2022-01-11 MED ORDER — HYDROCHLOROTHIAZIDE 12.5 MG PO CAPS: 12.5000 mg | ORAL_CAPSULE | Freq: Every day | ORAL | 1 refills | Status: DC

## 2022-01-11 MED ORDER — AMLODIPINE BESYLATE 5 MG PO TABS: 5.0000 mg | ORAL_TABLET | Freq: Every day | ORAL | 1 refills | Status: DC

## 2022-01-11 NOTE — Addendum Note (Signed)
Addended by: Teodora Medici on: 01/11/2022 08:28 AM   Modules accepted: Orders

## 2022-02-11 ENCOUNTER — Ambulatory Visit: Payer: Self-pay

## 2022-02-11 NOTE — Telephone Encounter (Signed)
  Chief Complaint: Question regarding RSV vaccine. Symptoms:  Frequency:  Pertinent Negatives: Patient denies  Disposition: '[]'$ ED /'[]'$ Urgent Care (no appt availability in office) / '[]'$ Appointment(In office/virtual)/ '[]'$  [facility] [facility]/ '[]'$ Home Care/ '[]'$ Refused Recommended Disposition /'[]'$ [facility]/ '[x]'$  Follow-up with PCP Additional Notes: Pt would like to know if she should receive the RSV vaccine. Please respond Via MyChart.    Summary: RSV advice   Pt is calling to get advice should she get a RSV shot?      Reason for Disposition  [1] Caller requesting NON-URGENT health information AND [2] PCP's office is the best resource  Answer Assessment - Initial Assessment Questions 1. REASON FOR CALL or QUESTION: "What is your reason for calling today?" or "How can I best help you?" or "What question do you have that I can help answer?"     Should pt receive the RSV vaccination?  Protocols used: Information Only Call - No Triage-A-AH

## 2022-02-14 ENCOUNTER — Ambulatory Visit: Payer: Medicare Other | Admitting: Internal Medicine

## 2022-02-21 ENCOUNTER — Other Ambulatory Visit: Payer: Self-pay | Admitting: Internal Medicine

## 2022-02-21 DIAGNOSIS — I10 Essential (primary) hypertension: Secondary | ICD-10-CM

## 2022-02-21 NOTE — Telephone Encounter (Signed)
Requested Prescriptions  Pending Prescriptions Disp Refills  . amLODipine (NORVASC) 5 MG tablet [Pharmacy Med Name: AMLODIPINE BESYLATE '5MG'$  TABLETS] 60 tablet 2    Sig: TAKE 1 TABLET BY MOUTH DAILY     Cardiovascular: Calcium Channel Blockers 2 Failed - 02/21/2022  8:42 AM      Failed - Last BP in normal range    BP Readings from Last 1 Encounters:  01/10/22 (!) 156/72         Passed - Last Heart Rate in normal range    Pulse Readings from Last 1 Encounters:  01/10/22 73         Passed - Valid encounter within last 6 months    Recent Outpatient Visits          1 month ago Hypertension, unspecified type   [place], DO   2 months ago Primary hypertension   [place], DO   8 months ago Hypertension, unspecified type   [facility] Teodora Medici, DO             . hydrochlorothiazide (MICROZIDE) 12.5 MG capsule [Pharmacy Med Name: HYDROCHLOROTHIAZIDE 12.'5MG'$  CAPSULES] 60 capsule 2    Sig: TAKE ONE CAPSULE BY MOUTH DAILY     Cardiovascular: Diuretics - Thiazide Failed - 02/21/2022  8:42 AM      Failed - Cr in normal range and within 180 days    Creat  Date Value Ref Range Status  01/10/2022 1.22 (H) 0.60 - 1.00 mg/dL Final         Failed - Last BP in normal range    BP Readings from Last 1 Encounters:  01/10/22 (!) 156/72         Passed - K in normal range and within 180 days    Potassium  Date Value Ref Range Status  01/10/2022 4.9 3.5 - 5.3 mmol/L Final         Passed - Na in normal range and within 180 days    Sodium  Date Value Ref Range Status  01/10/2022 141 135 - 146 mmol/L Final  02/21/2021 140 137 - 147 Final         Passed - Valid encounter within last 6 months    Recent Outpatient Visits          1 month ago Hypertension, unspecified type   [place], DO   2 months ago Primary hypertension   [place], DO   8 months ago Hypertension, unspecified type   [facility] Teodora Medici, [place]

## 2022-03-06 ENCOUNTER — Other Ambulatory Visit: Payer: Self-pay | Admitting: Internal Medicine

## 2022-03-06 DIAGNOSIS — I10 Essential (primary) hypertension: Secondary | ICD-10-CM

## 2022-03-06 NOTE — Telephone Encounter (Signed)
Requested Prescriptions  Pending Prescriptions Disp Refills  . amLODipine (NORVASC) 5 MG tablet [Pharmacy Med Name: AMLODIPINE BESYLATE '5MG'$  TABLETS] 60 tablet 2    Sig: TAKE 1 TABLET BY MOUTH DAILY     Cardiovascular: Calcium Channel Blockers 2 Failed - 03/06/2022 11:35 AM      Failed - Last BP in normal range    BP Readings from Last 1 Encounters:  01/10/22 (!) 156/72         Passed - Last Heart Rate in normal range    Pulse Readings from Last 1 Encounters:  01/10/22 73         Passed - Valid encounter within last 6 months    Recent Outpatient Visits          1 month ago Hypertension, unspecified type   [place], DO   3 months ago Primary hypertension   [place], DO   9 months ago Hypertension, unspecified type   [facility] Teodora Medici, [place]

## 2022-03-06 NOTE — Telephone Encounter (Signed)
Called pharmacy, transmission failed on med 02/21/22. Attempted to reach pt, does she want amount sent to local pharmacy? Left VM to call back to advise.

## 2022-03-06 NOTE — Telephone Encounter (Addendum)
Patient called in says she only has one pill left, of amlodipine,I did let her know the status of refill

## 2022-03-15 ENCOUNTER — Ambulatory Visit: Payer: Medicare Other | Admitting: Internal Medicine

## 2022-05-30 ENCOUNTER — Other Ambulatory Visit: Payer: Self-pay | Admitting: Internal Medicine

## 2022-05-30 DIAGNOSIS — I10 Essential (primary) hypertension: Secondary | ICD-10-CM

## 2022-05-30 MED ORDER — ATORVASTATIN CALCIUM 40 MG PO TABS: 40.0000 mg | ORAL_TABLET | Freq: Every day | ORAL | 1 refills | Status: DC

## 2022-05-30 MED ORDER — HYDROCHLOROTHIAZIDE 12.5 MG PO CAPS: 12.5000 mg | ORAL_CAPSULE | Freq: Every day | ORAL | 0 refills | Status: DC

## 2022-05-30 MED ORDER — AMLODIPINE BESYLATE 5 MG PO TABS: 5.0000 mg | ORAL_TABLET | Freq: Every day | ORAL | 0 refills | Status: DC

## 2022-05-30 NOTE — Telephone Encounter (Signed)
Medication Refill - Medication: amLODipine (NORVASC) 5 MG tablet  atorvastatin (LIPITOR) 40 MG tablet  hydrochlorothiazide (MICROZIDE) 12.5 MG capsule   Has the patient contacted their pharmacy? No.  Patient states that she has changed her Pharmacy to mail order.   Preferred Pharmacy (with phone number or street name):  [place], [place] Phone: [phone]  Fax: [phone]     Has the patient been seen for an appointment in the last year OR does the patient have an upcoming appointment? Yes.    Agent: Please be advised that RX refills may take up to 3 business days. We ask that you follow-up with your pharmacy.

## 2022-05-30 NOTE — Telephone Encounter (Signed)
Requested Prescriptions  Pending Prescriptions Disp Refills   amLODipine (NORVASC) 5 MG tablet 90 tablet 0    Sig: Take 1 tablet (5 mg total) by mouth daily.     Cardiovascular: Calcium Channel Blockers 2 Failed - 05/30/2022  3:59 PM      Failed - Last BP in normal range    BP Readings from Last 1 Encounters:  01/10/22 (!) 156/72         Passed - Last Heart Rate in normal range    Pulse Readings from Last 1 Encounters:  01/10/22 73         Passed - Valid encounter within last 6 months    Recent Outpatient Visits           4 months ago Hypertension, unspecified type   [place], DO   5 months ago Primary hypertension   [facility] Teodora Medici, DO   11 months ago Hypertension, unspecified type   [facility] Teodora Medici, DO               atorvastatin (LIPITOR) 40 MG tablet 90 tablet 1    Sig: Take 1 tablet (40 mg total) by mouth daily.     Cardiovascular:  Antilipid - Statins Failed - 05/30/2022  3:59 PM      Failed - Lipid Panel in normal range within the last 12 months    Cholesterol  Date Value Ref Range Status  01/10/2022 195 <200 mg/dL Final   LDL Cholesterol (Calc)  Date Value Ref Range Status  01/10/2022 113 (H) mg/dL (calc) Final    Comment:    Reference range: <100 . Desirable range <100 mg/dL for primary prevention;   <70 mg/dL for patients with CHD or diabetic patients  with > or = 2 CHD risk factors. Marland Kitchen LDL-C is now calculated using the Martin-Hopkins  calculation, which is a validated novel method providing  better accuracy than the Friedewald equation in the  estimation of LDL-C.  Cresenciano Genre et al. Annamaria Helling. 2993;716(96): 2061-2068  (http://education.[facility].com/faq/FAQ164)    HDL  Date Value Ref Range Status  01/10/2022 43 (L) > OR = 50 mg/dL Final   Triglycerides  Date Value Ref Range Status  01/10/2022 275 (H) <150 mg/dL Final    Comment:     . If a non-fasting specimen was collected, consider repeat triglyceride testing on a fasting specimen if clinically indicated.  Yates Decamp et al. J. of Clin. Lipidol. 7893;8:101-751. Marland Kitchen          Passed - Patient is not pregnant      Passed - Valid encounter within last 12 months    Recent Outpatient Visits           4 months ago Hypertension, unspecified type   [facility] Teodora Medici, DO   5 months ago Primary hypertension   [place], DO   11 months ago Hypertension, unspecified type   [facility] Teodora Medici, DO               hydrochlorothiazide (MICROZIDE) 12.5 MG capsule 90 capsule 0    Sig: Take 1 capsule (12.5 mg total) by mouth daily.     Cardiovascular: Diuretics - Thiazide Failed - 05/30/2022  3:59 PM      Failed - Cr in normal range and within 180 days    Creat  Date Value Ref Range Status  01/10/2022 1.22 (H) 0.60 -  1.00 mg/dL Final         Failed - Last BP in normal range    BP Readings from Last 1 Encounters:  01/10/22 (!) 156/72         Passed - K in normal range and within 180 days    Potassium  Date Value Ref Range Status  01/10/2022 4.9 3.5 - 5.3 mmol/L Final         Passed - Na in normal range and within 180 days    Sodium  Date Value Ref Range Status  01/10/2022 141 135 - 146 mmol/L Final  02/21/2021 140 137 - 147 Final         Passed - Valid encounter within last 6 months    Recent Outpatient Visits           4 months ago Hypertension, unspecified type   [place], DO   5 months ago Primary hypertension   [place], DO   11 months ago Hypertension, unspecified type   [facility] Teodora Medici, [place]

## 2022-06-01 DIAGNOSIS — Z23 Encounter for immunization: Secondary | ICD-10-CM | POA: Diagnosis not present

## 2022-06-28 ENCOUNTER — Ambulatory Visit (INDEPENDENT_AMBULATORY_CARE_PROVIDER_SITE_OTHER): Payer: Medicare Other

## 2022-06-28 VITALS — Ht 67.0 in | Wt 162.0 lb

## 2022-06-28 DIAGNOSIS — Z0000 Encounter for general adult medical examination without abnormal findings: Secondary | ICD-10-CM | POA: Diagnosis not present

## 2022-06-28 DIAGNOSIS — Z1231 Encounter for screening mammogram for malignant neoplasm of breast: Secondary | ICD-10-CM

## 2022-06-28 DIAGNOSIS — Z13820 Encounter for screening for osteoporosis: Secondary | ICD-10-CM

## 2022-06-28 DIAGNOSIS — Z1211 Encounter for screening for malignant neoplasm of colon: Secondary | ICD-10-CM

## 2022-06-28 NOTE — Patient Instructions (Signed)
Karen Sims , Thank you for taking time to come for your Medicare Wellness Visit. I appreciate your ongoing commitment to your health goals. Please review the following plan we discussed and let me know if I can assist you in the future.   These are the goals we discussed:  Goals   None     This is a list of the screening recommended for you and due dates:  Health Maintenance  Topic Date Due   DTaP/Tdap/Td vaccine (1 - Tdap) Never done   Colon Cancer Screening  Never done   Zoster (Shingles) Vaccine (1 of 2) Never done   Medicare Annual Wellness Visit  10/01/2018   Flu Shot  12/25/2021   COVID-19 Vaccine (5 - 2023-24 season) 01/25/2022   Mammogram  03/13/2023   Pneumonia Vaccine  Completed   DEXA scan (bone density measurement)  Completed   Hepatitis C Screening: USPSTF Recommendation to screen - Ages 73-79 yo.  Completed   HPV Vaccine  Aged Out    Advanced directives: no  Conditions/risks identified: none  Next appointment: Follow up in one year for your annual wellness visit 07/04/2023 '@3'$ :15/telephone   Preventive Care 65 Years and Older, Female Preventive care refers to lifestyle choices and visits with your health care provider that can promote health and wellness. What does preventive care include? A yearly physical exam. This is also called an annual well check. Dental exams once or twice a year. Routine eye exams. Ask your health care provider how often you should have your eyes checked. Personal lifestyle choices, including: Daily care of your teeth and gums. Regular physical activity. Eating a healthy diet. Avoiding tobacco and drug use. Limiting alcohol use. Practicing safe sex. Taking low-dose aspirin every day. Taking vitamin and mineral supplements as recommended by your health care provider. What happens during an annual well check? The services and screenings done by your health care provider during your annual well check will depend on your age, overall  health, lifestyle risk factors, and family history of disease. Counseling  Your health care provider may ask you questions about your: Alcohol use. Tobacco use. Drug use. Emotional well-being. Home and relationship well-being. Sexual activity. Eating habits. History of falls. Memory and ability to understand (cognition). Work and work Statistician. Reproductive health. Screening  You may have the following tests or measurements: Height, weight, and BMI. Blood pressure. Lipid and cholesterol levels. These may be checked every 5 years, or more frequently if you are over 81 years old. Skin check. Lung cancer screening. You may have this screening every year starting at age 75 if you have a 30-pack-year history of smoking and currently smoke or have quit within the past 15 years. Fecal occult blood test (FOBT) of the stool. You may have this test every year starting at age 23. Flexible sigmoidoscopy or colonoscopy. You may have a sigmoidoscopy every 5 years or a colonoscopy every 10 years starting at age 23. Hepatitis C blood test. Hepatitis B blood test. Sexually transmitted disease (STD) testing. Diabetes screening. This is done by checking your blood sugar (glucose) after you have not eaten for a while (fasting). You may have this done every 1-3 years. Bone density scan. This is done to screen for osteoporosis. You may have this done starting at age 51. Mammogram. This may be done every 1-2 years. Talk to your health care provider about how often you should have regular mammograms. Talk with your health care provider about your test results, treatment options, and  if necessary, the need for more tests. Vaccines  Your health care provider may recommend certain vaccines, such as: Influenza vaccine. This is recommended every year. Tetanus, diphtheria, and acellular pertussis (Tdap, Td) vaccine. You may need a Td booster every 10 years. Zoster vaccine. You may need this after age  57. Pneumococcal 13-valent conjugate (PCV13) vaccine. One dose is recommended after age 66. Pneumococcal polysaccharide (PPSV23) vaccine. One dose is recommended after age 69. Talk to your health care provider about which screenings and vaccines you need and how often you need them. This information is not intended to replace advice given to you by your health care provider. Make sure you discuss any questions you have with your health care provider. Document Released: 06/09/2015 Document Revised: 01/31/2016 Document Reviewed: 03/14/2015 Elsevier Interactive Patient Education  2017 [place] Prevention in the Home Falls can cause injuries. They can happen to people of all ages. There are many things you can do to make your home safe and to help prevent falls. What can I do on the outside of my home? Regularly fix the edges of walkways and driveways and fix any cracks. Remove anything that might make you trip as you walk through a door, such as a raised step or threshold. Trim any bushes or trees on the path to your home. Use bright outdoor lighting. Clear any walking paths of anything that might make someone trip, such as rocks or tools. Regularly check to see if handrails are loose or broken. Make sure that both sides of any steps have handrails. Any raised decks and porches should have guardrails on the edges. Have any leaves, snow, or ice cleared regularly. Use sand or salt on walking paths during winter. Clean up any spills in your garage right away. This includes oil or grease spills. What can I do in the bathroom? Use night lights. Install grab bars by the toilet and in the tub and shower. Do not use towel bars as grab bars. Use non-skid mats or decals in the tub or shower. If you need to sit down in the shower, use a plastic, non-slip stool. Keep the floor dry. Clean up any water that spills on the floor as soon as it happens. Remove soap buildup in the tub or shower  regularly. Attach bath mats securely with double-sided non-slip rug tape. Do not have throw rugs and other things on the floor that can make you trip. What can I do in the bedroom? Use night lights. Make sure that you have a light by your bed that is easy to reach. Do not use any sheets or blankets that are too big for your bed. They should not hang down onto the floor. Have a firm chair that has side arms. You can use this for support while you get dressed. Do not have throw rugs and other things on the floor that can make you trip. What can I do in the kitchen? Clean up any spills right away. Avoid walking on wet floors. Keep items that you use a lot in easy-to-reach places. If you need to reach something above you, use a strong step stool that has a grab bar. Keep electrical cords out of the way. Do not use floor polish or wax that makes floors slippery. If you must use wax, use non-skid floor wax. Do not have throw rugs and other things on the floor that can make you trip. What can I do with my stairs? Do not leave any  items on the stairs. Make sure that there are handrails on both sides of the stairs and use them. Fix handrails that are broken or loose. Make sure that handrails are as long as the stairways. Check any carpeting to make sure that it is firmly attached to the stairs. Fix any carpet that is loose or worn. Avoid having throw rugs at the top or bottom of the stairs. If you do have throw rugs, attach them to the floor with carpet tape. Make sure that you have a light switch at the top of the stairs and the bottom of the stairs. If you do not have them, ask someone to add them for you. What else can I do to help prevent falls? Wear shoes that: Do not have high heels. Have rubber bottoms. Are comfortable and fit you well. Are closed at the toe. Do not wear sandals. If you use a stepladder: Make sure that it is fully opened. Do not climb a closed stepladder. Make sure that  both sides of the stepladder are locked into place. Ask someone to hold it for you, if possible. Clearly mark and make sure that you can see: Any grab bars or handrails. First and last steps. Where the edge of each step is. Use tools that help you move around (mobility aids) if they are needed. These include: Canes. Walkers. Scooters. Crutches. Turn on the lights when you go into a dark area. Replace any light bulbs as soon as they burn out. Set up your furniture so you have a clear path. Avoid moving your furniture around. If any of your floors are uneven, fix them. If there are any pets around you, be aware of where they are. Review your medicines with your doctor. Some medicines can make you feel dizzy. This can increase your chance of falling. Ask your doctor what other things that you can do to help prevent falls. This information is not intended to replace advice given to you by your health care provider. Make sure you discuss any questions you have with your health care provider. Document Released: 03/09/2009 Document Revised: 10/19/2015 Document Reviewed: 06/17/2014 Elsevier Interactive Patient Education  2017 [employer].

## 2022-06-28 NOTE — Progress Notes (Signed)
Virtual Visit via Telephone Note  I connected with  [name] on 06/28/22 at  3:15 PM EST by telephone and verified that I am speaking with the correct person using two identifiers.  Location: Patient: home Provider: [facility] Persons participating in the virtual visit: patient/Nurse Health Advisor   I discussed the limitations, risks, security and privacy concerns of performing an evaluation and management service by telephone and the availability of in person appointments. The patient expressed understanding and agreed to proceed.  Interactive audio and video telecommunications were attempted between this nurse and patient, however failed, due to patient having technical difficulties OR patient did not have access to video capability.  We continued and completed visit with audio only.  Some vital signs may be absent or patient reported.   Roger Shelter, LPN  Subjective:   Karen Sims is a 72 y.o. female who presents for Medicare Annual (Subsequent) preventive examination.  Review of Systems           Objective:    Today's Vitals   06/28/22 1516  Weight: 162 lb (73.5 kg)  Height: '5\' 7"'$  (1.702 m)   Body mass index is 25.37 kg/m.     06/28/2022    3:28 PM  Advanced Directives  Does Patient Have a Medical Advance Directive? No  Would patient like information on creating a medical advance directive? No - Patient declined    Current Medications (verified) Outpatient Encounter Medications as of 06/28/2022  Medication Sig   amLODipine (NORVASC) 5 MG tablet Take 1 tablet (5 mg total) by mouth daily.   atorvastatin (LIPITOR) 40 MG tablet Take 1 tablet (40 mg total) by mouth daily.   folic acid (FOLVITE) 1 MG tablet Take 1 mg by mouth daily.   hydrochlorothiazide (MICROZIDE) 12.5 MG capsule Take 1 capsule (12.5 mg total) by mouth daily.   Turmeric (QC TUMERIC COMPLEX PO) Take by mouth daily.   VITAMIN D PO Take by mouth daily.   nabumetone (RELAFEN) 750 MG tablet  Take 1 tablet (750 mg total) by mouth daily. (Patient not taking: Reported on 06/28/2022)   No facility-administered encounter medications on file as of 06/28/2022.    Allergies (verified) Methotrexate, Aspirin, and Codeine   History: Past Medical History:  Diagnosis Date   Hyperlipidemia    Hypertension    RA (rheumatoid arthritis) ([place])    Past Surgical History:  Procedure Laterality Date   ABDOMINAL HYSTERECTOMY     AUGMENTATION MAMMAPLASTY     2002   TUBAL LIGATION     Family History  Problem Relation Age of Onset   Cancer Mother        colon   Hypertension Father    Hyperlipidemia Father    Stroke Father    Kidney disease Father    Social History   Socioeconomic History   Marital status: Married    Spouse name: Not on file   Number of children: Not on file   Years of education: Not on file   Highest education level: Not on file  Occupational History   Not on file  Tobacco Use   Smoking status: Never   Smokeless tobacco: Never  Vaping Use   Vaping Use: Never used  Substance and Sexual Activity   Alcohol use: Yes    Alcohol/week: 6.0 standard drinks of alcohol    Types: 6 Glasses of wine per week   Drug use: Never   Sexual activity: Not Currently  Other Topics Concern   Not  on file  Social History Narrative   Not on file   Social Determinants of Health   Financial Resource Strain: Low Risk  (06/28/2022)   Overall Financial Resource Strain (CARDIA)    Difficulty of Paying Living Expenses: Not hard at all  Food Insecurity: No Food Insecurity (06/28/2022)   Hunger Vital Sign    Worried About Running Out of Food in the Last Year: Never true    Ran Out of Food in the Last Year: Never true  Transportation Needs: No Transportation Needs (06/28/2022)   PRAPARE - Hydrologist (Medical): No    Lack of Transportation (Non-Medical): No  Physical Activity: Inactive (06/28/2022)   Exercise Vital Sign    Days of Exercise per Week: 0 days     Minutes of Exercise per Session: 0 min  Stress: No Stress Concern Present (06/28/2022)   [place]    Feeling of Stress : Not at all  Social Connections: Moderately Isolated (06/28/2022)   Social Connection and Isolation Panel [NHANES]    Frequency of Communication with Friends and Family: More than three times a week    Frequency of Social Gatherings with Friends and Family: Twice a week    Attends Religious Services: Never    Marine scientist or Organizations: No    Attends Music therapist: Never    Marital Status: Married    Tobacco Counseling Counseling given: Not Answered   Clinical Intake:  Pre-visit preparation completed: Yes  Pain : No/denies pain     BMI - recorded: 25.37 Nutritional Status: BMI 25 -29 Overweight Nutritional Risks: None Diabetes: No  How often do you need to have someone help you when you read instructions, pamphlets, or other written materials from your doctor or pharmacy?: 1 - Never  Diabetic?no  Interpreter Needed?: No  Information entered by :: B.Kayln Garceau,LPN   Activities of Daily Living    06/28/2022    3:28 PM 01/10/2022    9:04 AM  In your present state of health, do you have any difficulty performing the following activities:  Hearing? 0 0  Vision? 0 0  Difficulty concentrating or making decisions? 0 0  Walking or climbing stairs? 0 0  Dressing or bathing? 0 0  Doing errands, shopping? 0 0  Preparing Food and eating ? N   Using the Toilet? N   In the past six months, have you accidently leaked urine? N   Do you have problems with loss of bowel control? N   Managing your Medications? N   Managing your Finances? N   Housekeeping or managing your Housekeeping? N     Patient Care Team: Teodora Medici, DO as PCP - General (Internal Medicine)  Indicate any recent Medical Services you may have received from other than [facility] providers in the past  year (date may be approximate).     Assessment:   This is a routine wellness examination for [name].  Hearing/Vision screen Hearing Screening - Comments:: Hearing adequate Vision Screening - Comments:: Distance glasses only.last visit 5years  Dietary issues and exercise activities discussed:     Goals Addressed   None    Depression Screen    06/28/2022    3:23 PM 01/10/2022    9:04 AM 12/06/2021    9:09 AM 06/05/2021    9:42 AM  PHQ 2/9 Scores  PHQ - 2 Score 0 0 0 0  PHQ- 9 Score  0  0 0    Fall Risk    06/28/2022    3:19 PM 01/10/2022    9:04 AM 12/06/2021    9:07 AM 06/05/2021    9:42 AM  Fall Risk   Falls in the past year? 0 0 0 0  Number falls in past yr: 0 0 0 0  Injury with Fall? 0 0 0 0  Risk for fall due to : No Fall Risks No Fall Risks    Follow up Education provided;Falls prevention discussed Falls prevention discussed      FALL RISK PREVENTION PERTAINING TO THE HOME:  Any stairs in or around the home? Yes outside If so, are there any without handrails? Yes  Home free of loose throw rugs in walkways, pet beds, electrical cords, etc? Yes  Adequate lighting in your home to reduce risk of falls? Yes   ASSISTIVE DEVICES UTILIZED TO PREVENT FALLS:  Life alert? No  Use of a cane, walker or w/c? No  Grab bars in the bathroom? No  Shower chair or bench in shower? No  Elevated toilet seat or a handicapped toilet? No     Cognitive Function:        06/28/2022    3:32 PM  6CIT Screen  What Year? 0 points  What month? 0 points  What time? 0 points  Count back from 20 0 points  Months in reverse 0 points  Repeat phrase 2 points  Total Score 2 points    Immunizations Immunization History  Administered Date(s) Administered   Influenza-Unspecified 04/02/2018   Moderna SARS-COV2 Booster Vaccination 04/13/2020   Moderna Sars-Covid-2 Vaccination 07/03/2019, 08/03/2019, 04/07/2021   PNEUMOCOCCAL CONJUGATE-20 12/06/2021   Pneumococcal Conjugate-13 11/26/2016     TDAP status: Up to date  Flu Vaccine status: Up to date  Pneumococcal vaccine status: Up to date  Covid-19 vaccine status: Completed vaccines  Qualifies for Shingles Vaccine? Yes   Zostavax completed Yes  1st dose Shingrix Completed?: Yes  RSV vaccine  Screening Tests Health Maintenance  Topic Date Due   DTaP/Tdap/Td (1 - Tdap) Never done   COLONOSCOPY (Pts 45-51yr Insurance coverage will need to be confirmed)  Never done   Zoster Vaccines- Shingrix (1 of 2) Never done   INFLUENZA VACCINE  12/25/2021   COVID-19 Vaccine (5 - 2023-24 season) 01/25/2022   MAMMOGRAM  03/13/2023   Medicare Annual Wellness (AWV)  06/29/2023   Pneumonia Vaccine 72 Years old  Completed   DEXA SCAN  Completed   Hepatitis C Screening  Completed   HPV VACCINES  Aged Out    Health Maintenance  Health Maintenance Due  Topic Date Due   DTaP/Tdap/Td (1 - Tdap) Never done   COLONOSCOPY (Pts 45-47yrInsurance coverage will need to be confirmed)  Never done   Zoster Vaccines- Shingrix (1 of 2) Never done   INFLUENZA VACCINE  12/25/2021   COVID-19 Vaccine (5 - 2023-24 season) 01/25/2022    Colorectal cancer screening: Type of screening: Colonoscopy. Completed DUE. Repeat every 5 years ORDER placed  Mammogram status: Ordered yes. Pt provided with contact info and advised to call to schedule appt. Cancelled order as pop-up states done w/in one year  Bone Density status: Completed yes. Results reflect: Bone density results: NORMAL. Repeat every 5 years.  Lung Cancer Screening: (Low Dose CT Chest recommended if Age 72-80ears, 30 pack-year currently smoking OR have quit w/in 15years.) does not qualify.   Lung Cancer Screening Referral: no  Additional Screening:  Hepatitis C Screening:  does not qualify; Completed no  Vision Screening: Recommended annual ophthalmology exams for early detection of glaucoma and other disorders of the eye. Is the patient up to date with their annual eye exam?   No  Who is the provider or what is the name of the office in which the patient attends annual eye exams? Will schedule soon If pt is not established with a provider, would they like to be referred to a provider to establish care? No .   Dental Screening: Recommended annual dental exams for proper oral hygiene  Community Resource Referral / Chronic Care Management: CRR required this visit?  No   CCM required this visit?  No      Plan:     I have personally reviewed and noted the following in the patient's chart:   Medical and social history Use of alcohol, tobacco or illicit drugs  Current medications and supplements including opioid prescriptions. Patient is not currently taking opioid prescriptions. Functional ability and status Nutritional status Physical activity Advanced directives List of other physicians Hospitalizations, surgeries, and ER visits in previous 12 months Vitals Screenings to include cognitive, depression, and falls Referrals and appointments  In addition, I have reviewed and discussed with patient certain preventive protocols, quality metrics, and best practice recommendations. A written personalized care plan for preventive services as well as general preventive health recommendations were provided to patient.     Roger Shelter, LPN   07/30/4654   Nurse Notes: Pt indicates she is still working.  She reports np problems and states she is healthy.  Referral for colonoscopy placed. MMG cancelled as insurance warning popped up that pt has had a MMG within the last 45month.

## 2022-07-01 ENCOUNTER — Other Ambulatory Visit: Payer: Self-pay

## 2022-07-01 DIAGNOSIS — Z800 Family history of malignant neoplasm of digestive organs: Secondary | ICD-10-CM

## 2022-07-01 DIAGNOSIS — Z1211 Encounter for screening for malignant neoplasm of colon: Secondary | ICD-10-CM

## 2022-07-01 MED ORDER — SUTAB 1479-225-188 MG PO TABS: 12.0000 | ORAL_TABLET | Freq: Two times a day (BID) | ORAL | 0 refills | Status: DC

## 2022-07-01 NOTE — Addendum Note (Signed)
Addended by: Vanetta Mulders on: 07/01/2022 01:22 PM   Modules accepted: Orders

## 2022-07-17 ENCOUNTER — Encounter: Payer: Self-pay | Admitting: Gastroenterology

## 2022-07-18 ENCOUNTER — Encounter: Payer: Self-pay | Admitting: Gastroenterology

## 2022-07-18 ENCOUNTER — Encounter
Admission: RE | Disposition: A | Discharge status: Home / Self Care | Payer: Self-pay | Source: Home / Self Care | Attending: Gastroenterology

## 2022-07-18 ENCOUNTER — Ambulatory Visit
Admission: RE | Admit: 2022-07-18 | Discharge: 2022-07-18 | Disposition: A | Discharge status: Home / Self Care | Payer: Medicare Other | Attending: Gastroenterology | Admitting: Gastroenterology

## 2022-07-18 ENCOUNTER — Ambulatory Visit: Payer: Medicare Other | Admitting: Anesthesiology

## 2022-07-18 DIAGNOSIS — E785 Hyperlipidemia, unspecified: Secondary | ICD-10-CM | POA: Insufficient documentation

## 2022-07-18 DIAGNOSIS — Q439 Congenital malformation of intestine, unspecified: Secondary | ICD-10-CM | POA: Insufficient documentation

## 2022-07-18 DIAGNOSIS — I10 Essential (primary) hypertension: Secondary | ICD-10-CM | POA: Diagnosis not present

## 2022-07-18 DIAGNOSIS — K5730 Diverticulosis of large intestine without perforation or abscess without bleeding: Secondary | ICD-10-CM | POA: Insufficient documentation

## 2022-07-18 DIAGNOSIS — D126 Benign neoplasm of colon, unspecified: Secondary | ICD-10-CM | POA: Diagnosis not present

## 2022-07-18 DIAGNOSIS — Z800 Family history of malignant neoplasm of digestive organs: Secondary | ICD-10-CM

## 2022-07-18 DIAGNOSIS — Z79899 Other long term (current) drug therapy: Secondary | ICD-10-CM | POA: Diagnosis not present

## 2022-07-18 DIAGNOSIS — Z1211 Encounter for screening for malignant neoplasm of colon: Secondary | ICD-10-CM | POA: Insufficient documentation

## 2022-07-18 DIAGNOSIS — D122 Benign neoplasm of ascending colon: Secondary | ICD-10-CM | POA: Insufficient documentation

## 2022-07-18 DIAGNOSIS — K635 Polyp of colon: Secondary | ICD-10-CM | POA: Diagnosis not present

## 2022-07-18 HISTORY — PX: COLONOSCOPY WITH PROPOFOL: SHX5780

## 2022-07-18 SURGERY — COLONOSCOPY WITH PROPOFOL
Anesthesia: General

## 2022-07-18 MED ORDER — LIDOCAINE HCL (CARDIAC) PF 100 MG/5ML IV SOSY
PREFILLED_SYRINGE | INTRAVENOUS | Status: DC | PRN
  Administered 2022-07-18: 80 mg via INTRAVENOUS

## 2022-07-18 MED ORDER — SODIUM CHLORIDE 0.9 % IV SOLN: INTRAVENOUS | Status: DC

## 2022-07-18 MED ORDER — PROPOFOL 500 MG/50ML IV EMUL
INTRAVENOUS | Status: DC | PRN
  Administered 2022-07-18: 140 ug/kg/min via INTRAVENOUS

## 2022-07-18 MED ORDER — PROPOFOL 10 MG/ML IV BOLUS
INTRAVENOUS | Status: DC | PRN
  Administered 2022-07-18: 80 mg via INTRAVENOUS

## 2022-07-18 NOTE — H&P (Signed)
Karen Bellows, MD [address] [place], [place], [place], [address] [id number], [place], [place], [place], [address] Phone: [phone]  Fax: [phone]  Primary Care Physician:  Karen Medici, DO   Pre-Procedure History & Physical: HPI:  Karen Sims is a 72 y.o. female is here for an colonoscopy.   Past Medical History:  Diagnosis Date   Hyperlipidemia    Hypertension    RA (rheumatoid arthritis) ([place])     Past Surgical History:  Procedure Laterality Date   ABDOMINAL HYSTERECTOMY     AUGMENTATION MAMMAPLASTY     2002   TUBAL LIGATION      Prior to Admission medications   Medication Sig Start Date End Date Taking? Authorizing Provider  amLODipine (NORVASC) 5 MG tablet Take 1 tablet (5 mg total) by mouth daily. 05/30/22  Yes Karen Medici, DO  atorvastatin (LIPITOR) 40 MG tablet Take 1 tablet (40 mg total) by mouth daily. 05/30/22  Yes Karen Medici, DO  folic acid (FOLVITE) 1 MG tablet Take 1 mg by mouth daily.   Yes [provider]  hydrochlorothiazide (MICROZIDE) 12.5 MG capsule Take 1 capsule (12.5 mg total) by mouth daily. 05/30/22  Yes Karen Medici, DO  Sodium Sulfate-Mag Sulfate-KCl (SUTAB) [phone] MG TABS Take 12 tablets by mouth 2 (two) times daily. 07/01/22  Yes Karen Bellows, MD  VITAMIN D PO Take by mouth daily.   Yes [provider]  nabumetone (RELAFEN) 750 MG tablet Take 1 tablet (750 mg total) by mouth daily. Patient not taking: Reported on 06/28/2022 06/14/21   Karen Sizer, MD  Turmeric (QC TUMERIC COMPLEX PO) Take by mouth daily.    [provider]    Allergies as of 07/01/2022 - Review Complete 06/28/2022  Allergen Reaction Noted   Methotrexate Hives 06/05/2021   Aspirin Other (See Comments) 09/30/2017   Codeine Other (See Comments) 09/30/2017    Family History  Problem Relation Age of Onset   Cancer Mother        colon   Hypertension Father    Hyperlipidemia Father    Stroke Father     Kidney disease Father     Social History   Socioeconomic History   Marital status: Married    Spouse name: Not on file   Number of children: Not on file   Years of education: Not on file   Highest education level: Not on file  Occupational History   Not on file  Tobacco Use   Smoking status: Never   Smokeless tobacco: Never  Vaping Use   Vaping Use: Never used  Substance and Sexual Activity   Alcohol use: Yes    Alcohol/week: 6.0 standard drinks of alcohol    Types: 6 Glasses of wine per week   Drug use: Never   Sexual activity: Not Currently  Other Topics Concern   Not on file  Social History Narrative   Not on file   Social Determinants of Health   Financial Resource Strain: Low Risk  (06/28/2022)   Overall Financial Resource Strain (CARDIA)    Difficulty of Paying Living Expenses: Not hard at all  Food Insecurity: No Food Insecurity (06/28/2022)   Hunger Vital Sign    Worried About Running Out of Food in the Last Year: Never true    Ran Out of Food in the Last Year: Never true  Transportation Needs: No Transportation Needs (06/28/2022)   PRAPARE - Hydrologist (Medical): No  Lack of Transportation (Non-Medical): No  Physical Activity: Inactive (06/28/2022)   Exercise Vital Sign    Days of Exercise per Week: 0 days    Minutes of Exercise per Session: 0 min  Stress: No Stress Concern Present (06/28/2022)   [place]    Feeling of Stress : Not at all  Social Connections: Moderately Isolated (06/28/2022)   Social Connection and Isolation Panel [NHANES]    Frequency of Communication with Friends and Family: More than three times a week    Frequency of Social Gatherings with Friends and Family: Twice a week    Attends Religious Services: Never    Marine scientist or Organizations: No    Attends Archivist Meetings: Never    Marital Status: Married  Arboriculturist Violence: Not At Risk (06/28/2022)   Humiliation, Afraid, Rape, and Kick questionnaire    Fear of Current or Ex-Partner: No    Emotionally Abused: No    Physically Abused: No    Sexually Abused: No    Review of Systems: See HPI, otherwise negative ROS  Physical Exam: BP 135/81   Pulse 81   Temp (!) 97 F (36.1 C) (Temporal)   Resp 16   Wt 74.3 kg   SpO2 100%   BMI 25.65 kg/m  General:   Alert,  pleasant and cooperative in NAD Head:  Normocephalic and atraumatic. Neck:  Supple; no masses or thyromegaly. Lungs:  Clear throughout to auscultation, normal respiratory effort.    Heart:  +S1, +S2, Regular rate and rhythm, No edema. Abdomen:  Soft, nontender and nondistended. Normal bowel sounds, without guarding, and without rebound.   Neurologic:  Alert and  oriented x4;  grossly normal neurologically.  Impression/Plan: [name] is here for an colonoscopy to be performed for Screening colonoscopy , family history of colon cancer Risks, benefits, limitations, and alternatives regarding  colonoscopy have been reviewed with the patient.  Questions have been answered.  All parties agreeable.   Karen Bellows, MD  07/18/2022, 8:15 AM

## 2022-07-18 NOTE — Transfer of Care (Signed)
Immediate Anesthesia Transfer of Care Note  Patient: Karen Sims  Procedure(s) Performed: COLONOSCOPY WITH PROPOFOL  Patient Location: PACU  Anesthesia Type:General  Level of Consciousness: awake, alert , and oriented  Airway & Oxygen Therapy: Patient Spontanous Breathing  Post-op Assessment: Report given to RN and Post -op Vital signs reviewed and stable  Post vital signs: Reviewed and stable  Last Vitals:  Vitals Value Taken Time  BP    Temp    Pulse 64 07/18/22 0854  Resp 15 07/18/22 0854  SpO2 99 % 07/18/22 0854  Vitals shown include unvalidated device data.  Last Pain:  Vitals:   07/18/22 0733  TempSrc: Temporal         Complications: No notable events documented.

## 2022-07-18 NOTE — Anesthesia Postprocedure Evaluation (Signed)
Anesthesia Post Note  Patient: Karen Sims  Procedure(s) Performed: COLONOSCOPY WITH PROPOFOL  Patient location during evaluation: PACU Anesthesia Type: General Level of consciousness: awake and oriented Pain management: pain level controlled Vital Signs Assessment: post-procedure vital signs reviewed and stable Respiratory status: nonlabored ventilation Cardiovascular status: stable Anesthetic complications: no   No notable events documented.   Last Vitals:  Vitals:   07/18/22 0913 07/18/22 0921  BP: 139/68 139/68  Pulse:    Resp:    Temp: (!) 35.6 C   SpO2:      Last Pain:  Vitals:   07/18/22 0921  TempSrc:   PainSc: 0-No pain                 VAN STAVEREN,Nasim Habeeb

## 2022-07-18 NOTE — Anesthesia Preprocedure Evaluation (Signed)
Anesthesia Evaluation  Patient identified by MRN, date of birth, ID band Patient awake    Reviewed: Allergy & Precautions, NPO status , Patient's Chart, lab work & pertinent test results  Airway Mallampati: III  TM Distance: >3 FB Neck ROM: full    Dental  (+) Teeth Intact, Caps,    Pulmonary neg pulmonary ROS   Pulmonary exam normal breath sounds clear to auscultation       Cardiovascular Exercise Tolerance: Good hypertension, Pt. on medications negative cardio ROS Normal cardiovascular exam Rhythm:Regular Rate:Normal     Neuro/Psych negative neurological ROS  negative psych ROS   GI/Hepatic negative GI ROS, Neg liver ROS,,,  Endo/Other  negative endocrine ROS    Renal/GU negative Renal ROS  negative genitourinary   Musculoskeletal  (+) Arthritis ,    Abdominal Normal abdominal exam  (+)   Peds negative pediatric ROS (+)  Hematology negative hematology ROS (+)   Anesthesia Other Findings Past Medical History: No date: Hyperlipidemia No date: Hypertension No date: RA (rheumatoid arthritis) ([place])  Past Surgical History: No date: ABDOMINAL HYSTERECTOMY No date: AUGMENTATION MAMMAPLASTY     Comment:  2002 No date: TUBAL LIGATION  BMI    Body Mass Index: 25.65 kg/m      Reproductive/Obstetrics negative OB ROS                             Anesthesia Physical Anesthesia Plan  ASA: 2  Anesthesia Plan: General   Post-op Pain Management:    Induction: Intravenous  PONV Risk Score and Plan: Propofol infusion and TIVA  Airway Management Planned: Natural Airway  Additional Equipment:   Intra-op Plan:   Post-operative Plan:   Informed Consent: I have reviewed the patients History and Physical, chart, labs and discussed the procedure including the risks, benefits and alternatives for the proposed anesthesia with the patient or authorized representative who has indicated  his/her understanding and acceptance.     Dental Advisory Given  Plan Discussed with: CRNA and Surgeon  Anesthesia Plan Comments:        Anesthesia Quick Evaluation

## 2022-07-18 NOTE — Op Note (Signed)
[facility] Gastroenterology Patient Name: Karen Sims Procedure Date: 07/18/2022 8:14 AM MRN: [id number] Account #: [id number] Date of Birth: 29-Dec-1950 Admit Type: Outpatient Age: 72 Room: [facility] ENDO ROOM 4 Gender: Female Note Status: Finalized Instrument Name: Jasper Riling [id number] Procedure:             Colonoscopy Indications:           Screening in patient at increased risk: Family history                         of 1st-degree relative with colorectal cancer Providers:             Jonathon Bellows MD, MD Referring MD:          No Local Md, MD (Referring MD) Medicines:             Monitored Anesthesia Care Complications:         No immediate complications. Procedure:             Pre-Anesthesia Assessment:                        - Prior to the procedure, a History and Physical was                         performed, and patient medications, allergies and                         sensitivities were reviewed. The patient's tolerance                         of previous anesthesia was reviewed.                        - The risks and benefits of the procedure and the                         sedation options and risks were discussed with the                         patient. All questions were answered and informed                         consent was obtained.                        - ASA Grade Assessment: II - A patient with mild                         systemic disease.                        After obtaining informed consent, the colonoscope was                         passed under direct vision. Throughout the procedure,                         the patient's blood pressure, pulse, and oxygen  saturations were monitored continuously. The                         Colonoscope was introduced through the anus and                         advanced to the the cecum, identified by the                         appendiceal orifice. The colonoscopy was  performed                         with moderate difficulty due to significant looping.                         Successful completion of the procedure was aided by                         withdrawing the scope and replacing with the pediatric                         colonoscope. The patient tolerated the procedure well.                         The quality of the bowel preparation was good. The                         ileocecal valve, appendiceal orifice, and rectum were                         photographed. Findings:      The perianal and digital rectal examinations were normal.      A 5 mm polyp was found in the ascending colon. The polyp was sessile.       The polyp was removed with a cold snare. Resection and retrieval were       complete.      Multiple large-mouthed diverticula were found in the sigmoid colon.      The exam was otherwise without abnormality on direct and retroflexion       views. Impression:            - One 5 mm polyp in the ascending colon, removed with                         a cold snare. Resected and retrieved.                        - Diverticulosis in the sigmoid colon.                        - The examination was otherwise normal on direct and                         retroflexion views. Recommendation:        - Discharge patient to home (with escort).                        - Resume previous diet.                        -  Continue present medications.                        - Await pathology results.                        - Repeat colonoscopy is not recommended due to current                         age (28 years or older) for surveillance based on                         pathology results. Procedure Code(s):     --- Professional ---                        [phone], Colonoscopy, flexible; with removal of                         tumor(s), polyp(s), or other lesion(s) by snare                         technique Diagnosis Code(s):     --- Professional ---                         Z80.0, Family history of malignant neoplasm of                         digestive organs                        D12.2, Benign neoplasm of ascending colon                        K57.30, Diverticulosis of large intestine without                         perforation or abscess without bleeding CPT copyright 2022 American Medical Association. All rights reserved. The codes documented in this report are preliminary and upon coder review may  be revised to meet current compliance requirements. Jonathon Bellows, MD Jonathon Bellows MD, MD 07/18/2022 8:53:39 AM This report has been signed electronically. Number of Addenda: 0 Note Initiated On: 07/18/2022 8:14 AM Scope Withdrawal Time: 0 hours 12 minutes 47 seconds  Total Procedure Duration: 0 hours 29 minutes 31 seconds  Estimated Blood Loss:  Estimated blood loss: none.      [facility]

## 2022-07-19 ENCOUNTER — Encounter: Payer: Self-pay | Admitting: Gastroenterology

## 2022-07-19 LAB — SURGICAL PATHOLOGY

## 2022-07-22 ENCOUNTER — Encounter: Payer: Self-pay | Admitting: Gastroenterology

## 2022-07-22 ENCOUNTER — Telehealth: Payer: Self-pay | Admitting: Internal Medicine

## 2022-07-22 ENCOUNTER — Other Ambulatory Visit: Payer: Self-pay | Admitting: Internal Medicine

## 2022-07-22 DIAGNOSIS — I10 Essential (primary) hypertension: Secondary | ICD-10-CM

## 2022-07-22 NOTE — Telephone Encounter (Signed)
Medication Refill - Medication: atorvastatin (LIPITOR) 40 MG tablet , amLODipine (NORVASC) 5 MG tablet , hydrochlorothiazide (MICROZIDE) 12.5 MG capsule   Has the patient contacted their pharmacy? No.   Preferred Pharmacy (with phone number or street name):  [place], [place] Phone: [phone]  Fax: [phone]     Has the patient been seen for an appointment in the last year OR does the patient have an upcoming appointment? Yes.    Please assist patient further

## 2022-07-23 NOTE — Telephone Encounter (Signed)
Unable to refill per protocol, Rx requests are too soon. Last refill 05/30/22 for 90 days.  Requested Prescriptions  Pending Prescriptions Disp Refills   atorvastatin (LIPITOR) 40 MG tablet 90 tablet 1    Sig: Take 1 tablet (40 mg total) by mouth daily.     Cardiovascular:  Antilipid - Statins Failed - 07/22/2022  1:43 PM      Failed - Lipid Panel in normal range within the last 12 months    Cholesterol  Date Value Ref Range Status  01/10/2022 195 <200 mg/dL Final   LDL Cholesterol (Calc)  Date Value Ref Range Status  01/10/2022 113 (H) mg/dL (calc) Final    Comment:    Reference range: <100 . Desirable range <100 mg/dL for primary prevention;   <70 mg/dL for patients with CHD or diabetic patients  with > or = 2 CHD risk factors. Marland Kitchen LDL-C is now calculated using the Martin-Hopkins  calculation, which is a validated novel method providing  better accuracy than the Friedewald equation in the  estimation of LDL-C.  Cresenciano Genre et al. Annamaria Helling. [id number]): 2061-2068  (http://education.[facility].com/faq/FAQ164)    HDL  Date Value Ref Range Status  01/10/2022 43 (L) > OR = 50 mg/dL Final   Triglycerides  Date Value Ref Range Status  01/10/2022 275 (H) <150 mg/dL Final    Comment:    . If a non-fasting specimen was collected, consider repeat triglyceride testing on a fasting specimen if clinically indicated.  Yates Decamp et al. J. of Clin. Lipidol. [id number]. Marland Kitchen          Passed - Patient is not pregnant      Passed - Valid encounter within last 12 months    Recent Outpatient Visits           6 months ago Hypertension, unspecified type   [facility] Teodora Medici, DO   7 months ago Primary hypertension   [facility] Teodora Medici, DO   1 year ago Hypertension, unspecified type   [facility] Teodora Medici, DO       Future Appointments             In 1 month  Teodora Medici, [facility]             amLODipine (NORVASC) 5 MG tablet 90 tablet 0    Sig: Take 1 tablet (5 mg total) by mouth daily.     Cardiovascular: Calcium Channel Blockers 2 Passed - 07/22/2022  1:43 PM      Passed - Last BP in normal range    BP Readings from Last 1 Encounters:  07/18/22 139/68         Passed - Last Heart Rate in normal range    Pulse Readings from Last 1 Encounters:  07/18/22 81         Passed - Valid encounter within last 6 months    Recent Outpatient Visits           6 months ago Hypertension, unspecified type   [place], DO   7 months ago Primary hypertension   [facility] Teodora Medici, DO   1 year ago Hypertension, unspecified type   [facility] Teodora Medici, DO       Future Appointments             In 1 month Teodora Medici, DO [facility]             hydrochlorothiazide (MICROZIDE) 12.5 MG capsule 90 capsule 0    Sig: Take 1 capsule (12.5 mg total) by mouth daily.     Cardiovascular: Diuretics - Thiazide Failed - 07/22/2022  1:43 PM      Failed - Cr in normal range and within 180 days    Creat  Date Value Ref Range Status  01/10/2022 1.22 (H) 0.60 - 1.00 mg/dL Final         Failed - K in normal range and within 180 days    Potassium  Date Value Ref Range Status  01/10/2022 4.9 3.5 - 5.3 mmol/L Final         Failed - Na in normal range and within 180 days    Sodium  Date Value Ref Range Status  01/10/2022 141 135 - 146 mmol/L Final  02/21/2021 140 137 - 147 Final         Passed - Last BP in normal range    BP Readings from Last 1 Encounters:  07/18/22 139/68         Passed - Valid encounter within last 6 months    Recent Outpatient Visits           6 months ago Hypertension, unspecified type   [place], DO   7 months ago Primary hypertension   [facility] Teodora Medici, DO   1 year ago Hypertension, unspecified type   [facility] Teodora Medici, DO       Future Appointments             In 1 month Teodora Medici, [facility], [facility]

## 2022-08-14 ENCOUNTER — Other Ambulatory Visit: Payer: Self-pay | Admitting: Internal Medicine

## 2022-08-14 DIAGNOSIS — I10 Essential (primary) hypertension: Secondary | ICD-10-CM

## 2022-08-15 NOTE — Telephone Encounter (Signed)
Requested Prescriptions  Pending Prescriptions Disp Refills   hydrochlorothiazide (MICROZIDE) 12.5 MG capsule [Pharmacy Med Name: HYDROCHLOROTHIAZIDE CAPS 12.5MG ] 90 capsule 0    Sig: TAKE 1 CAPSULE DAILY     Cardiovascular: Diuretics - Thiazide Failed - 08/14/2022  9:25 AM      Failed - Cr in normal range and within 180 days    Creat  Date Value Ref Range Status  01/10/2022 1.22 (H) 0.60 - 1.00 mg/dL Final         Failed - K in normal range and within 180 days    Potassium  Date Value Ref Range Status  01/10/2022 4.9 3.5 - 5.3 mmol/L Final         Failed - Na in normal range and within 180 days    Sodium  Date Value Ref Range Status  01/10/2022 141 135 - 146 mmol/L Final  02/21/2021 140 137 - 147 Final         Passed - Last BP in normal range    BP Readings from Last 1 Encounters:  07/18/22 139/68         Passed - Valid encounter within last 6 months    Recent Outpatient Visits           7 months ago Hypertension, unspecified type   [place], DO   8 months ago Primary hypertension   [facility] Teodora Medici, DO   1 year ago Hypertension, unspecified type   [facility] Teodora Medici, DO       Future Appointments             In 1 week Teodora Medici, [facility], [facility]             amLODipine (NORVASC) 5 MG tablet [Pharmacy Med Name: AMLODIPINE BESYLATE TABS 5MG ] 90 tablet 0    Sig: TAKE 1 TABLET DAILY     Cardiovascular: Calcium Channel Blockers 2 Passed - 08/14/2022  9:25 AM      Passed - Last BP in normal range    BP Readings from Last 1 Encounters:  07/18/22 139/68         Passed - Last Heart Rate in normal range    Pulse Readings from Last 1 Encounters:  07/18/22 81         Passed - Valid encounter within last 6 months    Recent Outpatient Visits           7 months ago Hypertension, unspecified type    [place], DO   8 months ago Primary hypertension   [facility] Teodora Medici, DO   1 year ago Hypertension, unspecified type   [facility] Teodora Medici, DO       Future Appointments             In 1 week Teodora Medici, [facility], [facility]

## 2022-08-22 NOTE — Progress Notes (Signed)
Established Patient Office Visit  Subjective:  Patient ID: Karen Sims, female    DOB: 1950-12-19  Age: 72 y.o. MRN: [id number]  CC:  Chief Complaint  Patient presents with   Follow-up    HPI MAHRI ECKHOLM presents for follow up. Patient states she's been having a cough for the last 6 days. It is dry in nature. Her husband has similar symptoms.   Hypertension: -Medications: HCTZ 12.5 mg (increased to 25 mg but caused raise in creatinine), Amlodipine 5 mg  -Failed Meds: Losartan - saw Dermatology in the meantime who thinks her rash on her legs is due to the Losartan, Lisinopril caused cough -Patient is compliant with above medications and reports no side effects. -Checking BP at home (average): 130/80 -Denies any SOB, CP, vision changes, LE edema or symptoms of hypotension. Occasional headaches when BP is high, had one a few weeks ago. Headaches resolved.   HLD: -Medications: Lipitor 40 mg -Patient is compliant with above medications and reports no side effects.  -Last lipid panel: Lipid Panel     Component Value Date/Time   CHOL 195 01/10/2022 0935   TRIG 275 (H) 01/10/2022 0935   HDL 43 (L) 01/10/2022 0935   CHOLHDL 4.5 01/10/2022 0935   LDLCALC 113 (H) 01/10/2022 0935   RA: -Currently on Relafen  -Diagnosed in 2020, seen by [facility] in the past but not now -Had been on Methotrexate, severe side effects. Now not on any medications besides steroids as needed which she hasn't needed it in the last 2022.   Health Maintenance: -Blood work due -Breast cancer screening: Mammogram ordered -Colon cancer screening: colonoscopy 2/24, repeat in 10 years  Past Medical History:  Diagnosis Date   Hyperlipidemia    Hypertension    RA (rheumatoid arthritis) ([place])     Past Surgical History:  Procedure Laterality Date   ABDOMINAL HYSTERECTOMY     AUGMENTATION MAMMAPLASTY     2002   COLONOSCOPY WITH PROPOFOL N/A 07/18/2022   Procedure: COLONOSCOPY WITH  PROPOFOL;  Surgeon: Jonathon Bellows, MD;  Location: [facility] ENDOSCOPY;  Service: Gastroenterology;  Laterality: N/A;   TUBAL LIGATION      Family History  Problem Relation Age of Onset   Cancer Mother        colon   Hypertension Father    Hyperlipidemia Father    Stroke Father    Kidney disease Father     Social History   Socioeconomic History   Marital status: Married    Spouse name: Not on file   Number of children: Not on file   Years of education: Not on file   Highest education level: 12th grade  Occupational History   Not on file  Tobacco Use   Smoking status: Never   Smokeless tobacco: Never  Vaping Use   Vaping Use: Never used  Substance and Sexual Activity   Alcohol use: Yes    Alcohol/week: 6.0 standard drinks of alcohol    Types: 6 Glasses of wine per week   Drug use: Never   Sexual activity: Not Currently  Other Topics Concern   Not on file  Social History Narrative   Not on file   Social Determinants of Health   Financial Resource Strain: Low Risk  (08/19/2022)   Overall Financial Resource Strain (CARDIA)    Difficulty of Paying Living Expenses: Not hard at all  Food Insecurity: No Food Insecurity (08/19/2022)   Hunger Vital Sign    Worried About Running Out of  Food in the Last Year: Never true    [place] in the Last Year: Never true  Transportation Needs: No Transportation Needs (08/19/2022)   PRAPARE - Hydrologist (Medical): No    Lack of Transportation (Non-Medical): No  Physical Activity: Inactive (08/19/2022)   Exercise Vital Sign    Days of Exercise per Week: 0 days    Minutes of Exercise per Session: 0 min  Stress: No Stress Concern Present (08/19/2022)   [place]    Feeling of Stress : Not at all  Social Connections: Moderately Isolated (08/19/2022)   Social Connection and Isolation Panel [NHANES]    Frequency of Communication with Friends and  Family: Three times a week    Frequency of Social Gatherings with Friends and Family: Never    Attends Religious Services: Never    Marine scientist or Organizations: No    Attends Archivist Meetings: Never    Marital Status: Married  Human resources officer Violence: Not At Risk (06/28/2022)   Humiliation, Afraid, Rape, and Kick questionnaire    Fear of Current or Ex-Partner: No    Emotionally Abused: No    Physically Abused: No    Sexually Abused: No    ROS Review of Systems  Constitutional:  Negative for chills and fever.  Eyes:  Negative for visual disturbance.  Respiratory:  Positive for cough. Negative for shortness of breath.   Cardiovascular:  Negative for chest pain.  Gastrointestinal:  Negative for abdominal pain.  Skin:  Positive for rash.  Neurological:  Negative for dizziness.    Objective:   Today's Vitals: BP 134/68   Pulse 92   Temp 98.3 F (36.8 C)   Resp 16   Ht 5\' 6"  (1.676 m)   Wt 167 lb 12.8 oz (76.1 kg)   SpO2 97%   BMI 27.08 kg/m   Physical Exam Constitutional:      Appearance: Normal appearance.  HENT:     Head: Normocephalic and atraumatic.  Eyes:     Conjunctiva/sclera: Conjunctivae normal.  Cardiovascular:     Rate and Rhythm: Normal rate and regular rhythm.  Pulmonary:     Effort: Pulmonary effort is normal.     Breath sounds: Normal breath sounds. No wheezing, rhonchi or rales.  Musculoskeletal:     Right lower leg: No edema.     Left lower leg: No edema.  Skin:    General: Skin is warm and dry.     Comments: Macular erythematous patchy rash on lower calves with open lesion, no signs of infection  Neurological:     General: No focal deficit present.     Mental Status: She is alert. Mental status is at baseline.  Psychiatric:        Mood and Affect: Mood normal.        Behavior: Behavior normal.     Assessment & Plan:   1. Hypertension, unspecified type/Elevated serum creatinine/Medication management: Blood  pressure well controlled, continue HCTZ 12.5 mg and Amlodipine 5 mg. Recheck kidney function today.   - Basic Metabolic Panel (BMET)  2. Hyperlipidemia, unspecified hyperlipidemia type: Recheck cholesterol, doing well on Lipitor 40 mg.   - Lipid Profile  3. Acute cough: COVID test today, cough suppressants sent to pharmacy.   - benzonatate (TESSALON) 100 MG capsule; Take 1 capsule (100 mg total) by mouth 2 (two) times daily as needed for cough.  Dispense: 20  capsule; Refill: 0 - Novel Coronavirus, NAA ([facility])  4. Laceration of left lower extremity, sequela: Tdap administered today. Laceration clean, does not appear infected but open, keep area clean and covered.  - Tdap vaccine greater than or equal to 7yo IM   Follow-up: Return in about 3 months (around 11/23/2022).   Teodora Medici, DO

## 2022-08-23 ENCOUNTER — Encounter: Payer: Self-pay | Admitting: Internal Medicine

## 2022-08-23 ENCOUNTER — Ambulatory Visit (INDEPENDENT_AMBULATORY_CARE_PROVIDER_SITE_OTHER): Payer: Medicare Other | Admitting: Internal Medicine

## 2022-08-23 VITALS — BP 134/68 | HR 92 | Temp 98.3°F | Resp 16 | Ht 66.0 in | Wt 167.8 lb

## 2022-08-23 DIAGNOSIS — Z79899 Other long term (current) drug therapy: Secondary | ICD-10-CM | POA: Diagnosis not present

## 2022-08-23 DIAGNOSIS — R051 Acute cough: Secondary | ICD-10-CM

## 2022-08-23 DIAGNOSIS — E785 Hyperlipidemia, unspecified: Secondary | ICD-10-CM

## 2022-08-23 DIAGNOSIS — R7989 Other specified abnormal findings of blood chemistry: Secondary | ICD-10-CM | POA: Diagnosis not present

## 2022-08-23 DIAGNOSIS — I10 Essential (primary) hypertension: Secondary | ICD-10-CM | POA: Diagnosis not present

## 2022-08-23 DIAGNOSIS — S81812S Laceration without foreign body, left lower leg, sequela: Secondary | ICD-10-CM | POA: Diagnosis not present

## 2022-08-23 DIAGNOSIS — Z23 Encounter for immunization: Secondary | ICD-10-CM

## 2022-08-23 MED ORDER — BENZONATATE 100 MG PO CAPS: 100.0000 mg | ORAL_CAPSULE | Freq: Two times a day (BID) | ORAL | 0 refills | Status: DC | PRN

## 2022-08-23 NOTE — Patient Instructions (Addendum)
It was great seeing you today!  Plan discussed at today's visit: -Blood work ordered today, results will be uploaded to [place].  -COVID test today -Cough suppressant sent to pharmacy but let me know if symptoms get worse -No changes to daily medications today, blood pressure well controlled -Tdap vaccine given today  Follow up in: 3 months   Take care and let us know if you have any questions or concerns prior to your next visit.  Dr. Rosana Berger

## 2022-08-24 LAB — LIPID PANEL
Cholesterol: 160 mg/dL (ref <200)
HDL: 51 mg/dL (ref >=50)
LDL Cholesterol (Calc): 85 mg/dL (calc)
Non-HDL Cholesterol (Calc): 109 mg/dL (calc) (ref <130)
Total CHOL/HDL Ratio: 3.1 (calc) (ref <5.0)
Triglycerides: 143 mg/dL (ref <150)

## 2022-08-24 LAB — BASIC METABOLIC PANEL
BUN/Creatinine Ratio: 21 (calc) (ref 6–22)
BUN: 21 mg/dL (ref 7–25)
CO2: 29 mmol/L (ref 20–32)
Calcium: 9.6 mg/dL (ref 8.6–10.4)
Chloride: 101 mmol/L (ref 98–110)
Creat: 1.02 mg/dL — ABNORMAL HIGH (ref 0.60–1.00)
Glucose, Bld: 90 mg/dL (ref 65–99)
Potassium: 4.8 mmol/L (ref 3.5–5.3)
Sodium: 140 mmol/L (ref 135–146)

## 2022-08-24 LAB — NOVEL CORONAVIRUS, NAA: SARS-CoV-2, NAA: NOT DETECTED

## 2022-08-30 ENCOUNTER — Ambulatory Visit: Payer: Self-pay | Admitting: *Deleted

## 2022-08-30 ENCOUNTER — Telehealth: Payer: Self-pay | Admitting: Internal Medicine

## 2022-08-30 ENCOUNTER — Other Ambulatory Visit: Payer: Self-pay | Admitting: Internal Medicine

## 2022-08-30 DIAGNOSIS — R051 Acute cough: Secondary | ICD-10-CM

## 2022-08-30 MED ORDER — BENZONATATE 100 MG PO CAPS: 100.0000 mg | ORAL_CAPSULE | Freq: Two times a day (BID) | ORAL | 0 refills | Status: DC | PRN

## 2022-08-30 NOTE — Telephone Encounter (Signed)
Pt aware.

## 2022-08-30 NOTE — Telephone Encounter (Signed)
Summary: Cough Advice   Pt is calling to report that she was seen 08/23/22 was prescribed benzonatate (TESSALON) 100 MG capsule [[id number]]. Pt reports that she is still coughing. Please advise       Attempted to call patient- left message to call office

## 2022-08-30 NOTE — Telephone Encounter (Signed)
No answer from pt left vm to return my call

## 2022-08-30 NOTE — Telephone Encounter (Signed)
Patient called stated she prescribed benzonatate (TESSALON) 100 MG on Friday for a cough but it is not working so patient is requesting an alternate medication. She uses the  [facility] [address] [place], [place] - [address] Phone: [phone]  Fax: [phone]

## 2022-08-30 NOTE — Telephone Encounter (Signed)
    Chief Complaint: Pt. Seen 08/23/22 with cough. Tessalon Perles "are not helping at all." "Coughing all night. I'm using Hall's cough drops and Coricidin." Asking for something else to be called in Symptoms: Above Frequency: 2 weeks ago Pertinent Negatives: Patient denies fever Disposition: [] ED /[] Urgent Care (no appt availability in office) / [] Appointment(In office/virtual)/ []  [facility]/ [] Home Care/ [] Refused Recommended Disposition /[] [facility] [facility]/ [x]  Follow-up with PCP Additional Notes: Please advise pt.  Answer Assessment - Initial Assessment Questions 1. ONSET: "When did the cough begin?"      1-2 weeks ago 2. SEVERITY: "How bad is the cough today?"      Severe 3. SPUTUM: "Describe the color of your sputum" (none, dry cough; clear, white, yellow, green)     None 4. HEMOPTYSIS: "Are you coughing up any blood?" If so ask: "How much?" (flecks, streaks, tablespoons, etc.)     No 5. DIFFICULTY BREATHING: "Are you having difficulty breathing?" If Yes, ask: "How bad is it?" (e.g., mild, moderate, severe)    - MILD: No SOB at rest, mild SOB with walking, speaks normally in sentences, can lie down, no retractions, pulse < 100.    - MODERATE: SOB at rest, SOB with minimal exertion and prefers to sit, cannot lie down flat, speaks in phrases, mild retractions, audible wheezing, pulse 100-120.    - SEVERE: Very SOB at rest, speaks in single words, struggling to breathe, sitting hunched forward, retractions, pulse > 120      No 6. FEVER: "Do you have a fever?" If Yes, ask: "What is your temperature, how was it measured, and when did it start?"     No 7. CARDIAC HISTORY: "Do you have any history of heart disease?" (e.g., heart attack, congestive heart failure)      No 8. LUNG HISTORY: "Do you have any history of lung disease?"  (e.g., pulmonary embolus, asthma, emphysema)     No 9. PE RISK FACTORS: "Do you have a history of blood clots?" (or: recent major surgery,  recent prolonged travel, bedridden)     No 10. OTHER SYMPTOMS: "Do you have any other symptoms?" (e.g., runny nose, wheezing, chest pain)       No 11. PREGNANCY: "Is there any chance you are pregnant?" "When was your last menstrual period?"       No 12. TRAVEL: "Have you traveled out of the country in the last month?" (e.g., travel history, exposures)       No  Protocols used: Cough - Acute Non-Productive-A-AH

## 2022-08-30 NOTE — Telephone Encounter (Signed)
Pt declines codeine, pt would like to know if you could give her a refill on tessalon pills? If so, please send to [facility] [address], [place] - [address]  [address], [place] [address]

## 2022-10-01 NOTE — Progress Notes (Unsigned)
Established Patient Office Visit  Subjective:  Patient ID: Karen Sims, female    DOB: [birth year]  Age: 72 y.o. MRN: [id number]  CC:  No chief complaint on file.   HPI Karen Sims presents for follow up and to discuss RA.  Hypertension: -Medications: HCTZ 12.5 mg (increased to 25 mg but caused raise in creatinine), Amlodipine 5 mg  -Failed Meds: Losartan - saw Dermatology in the meantime who thinks her rash on her legs is due to the Losartan, Lisinopril caused cough -Patient is compliant with above medications and reports no side effects. -Checking BP at home (average): 130/80 -Denies any SOB, CP, vision changes, LE edema or symptoms of hypotension. Occasional headaches when BP is high, had one a few weeks ago. Headaches resolved.   HLD: -Medications: Lipitor 40 mg -Patient is compliant with above medications and reports no side effects.  -Last lipid panel: Lipid Panel     Component Value Date/Time   CHOL 160 08/23/2022 1038   TRIG 143 08/23/2022 1038   HDL 51 08/23/2022 1038   CHOLHDL 3.1 08/23/2022 1038   LDLCALC 85 08/23/2022 1038   RA: -Currently on Relafen  -Diagnosed in 2020, seen by [facility] in the past but not now -Had been on Methotrexate, severe side effects. Now not on any medications besides steroids as needed which she hasn't needed it in the last 2022.   Health Maintenance: -Blood work due -Breast cancer screening: Mammogram ordered -Colon cancer screening: colonoscopy 2/24, repeat in 10 years  Past Medical History:  Diagnosis Date   Hyperlipidemia    Hypertension    RA (rheumatoid arthritis) ([facility])     Past Surgical History:  Procedure Laterality Date   ABDOMINAL HYSTERECTOMY     AUGMENTATION MAMMAPLASTY     2002   COLONOSCOPY WITH PROPOFOL N/A 07/18/2022   Procedure: COLONOSCOPY WITH PROPOFOL;  Surgeon: Wyline Mood, MD;  Location: [facility] ENDOSCOPY;  Service: Gastroenterology;  Laterality: N/A;   TUBAL LIGATION       Family History  Problem Relation Age of Onset   Cancer Mother        colon   Hypertension Father    Hyperlipidemia Father    Stroke Father    Kidney disease Father     Social History   Socioeconomic History   Marital status: Married    Spouse name: Not on file   Number of children: Not on file   Years of education: Not on file   Highest education level: 12th grade  Occupational History   Not on file  Tobacco Use   Smoking status: Never   Smokeless tobacco: Never  Vaping Use   Vaping Use: Never used  Substance and Sexual Activity   Alcohol use: Yes    Alcohol/week: 6.0 standard drinks of alcohol    Types: 6 Glasses of wine per week   Drug use: Never   Sexual activity: Not Currently  Other Topics Concern   Not on file  Social History Narrative   Not on file   Social Determinants of Health   Financial Resource Strain: Low Risk  (08/19/2022)   Overall Financial Resource Strain (CARDIA)    Difficulty of Paying Living Expenses: Not hard at all  Food Insecurity: No Food Insecurity (08/19/2022)   Hunger Vital Sign    Worried About Running Out of Food in the Last Year: Never true    Ran Out of Food in the Last Year: Never true  Transportation Needs: No Transportation Needs (08/19/2022)  PRAPARE - Administrator, Civil Service (Medical): No    Lack of Transportation (Non-Medical): No  Physical Activity: Inactive (08/19/2022)   Exercise Vital Sign    Days of Exercise per Week: 0 days    Minutes of Exercise per Session: 0 min  Stress: No Stress Concern Present (08/19/2022)   [employer] of Occupational Health - Occupational Stress Questionnaire    Feeling of Stress : Not at all  Social Connections: Moderately Isolated (08/19/2022)   Social Connection and Isolation Panel [NHANES]    Frequency of Communication with Friends and Family: Three times a week    Frequency of Social Gatherings with Friends and Family: Never    Attends Religious Services: Never     Database administrator or Organizations: No    Attends Banker Meetings: Never    Marital Status: Married  Catering manager Violence: Not At Risk (06/28/2022)   Humiliation, Afraid, Rape, and Kick questionnaire    Fear of Current or Ex-Partner: No    Emotionally Abused: No    Physically Abused: No    Sexually Abused: No    ROS Review of Systems  Constitutional:  Negative for chills and fever.  Eyes:  Negative for visual disturbance.  Respiratory:  Positive for cough. Negative for shortness of breath.   Cardiovascular:  Negative for chest pain.  Gastrointestinal:  Negative for abdominal pain.  Skin:  Positive for rash.  Neurological:  Negative for dizziness.    Objective:   Today's Vitals: There were no vitals taken for this visit.  Physical Exam Constitutional:      Appearance: Normal appearance.  HENT:     Head: Normocephalic and atraumatic.  Eyes:     Conjunctiva/sclera: Conjunctivae normal.  Cardiovascular:     Rate and Rhythm: Normal rate and regular rhythm.  Pulmonary:     Effort: Pulmonary effort is normal.     Breath sounds: Normal breath sounds. No wheezing, rhonchi or rales.  Musculoskeletal:     Right lower leg: No edema.     Left lower leg: No edema.  Skin:    General: Skin is warm and dry.     Comments: Macular erythematous patchy rash on lower calves with open lesion, no signs of infection  Neurological:     General: No focal deficit present.     Mental Status: She is alert. Mental status is at baseline.  Psychiatric:        Mood and Affect: Mood normal.        Behavior: Behavior normal.     Assessment & Plan:   1. Hypertension, unspecified type/Elevated serum creatinine/Medication management: Blood pressure well controlled, continue HCTZ 12.5 mg and Amlodipine 5 mg. Recheck kidney function today.   - Basic Metabolic Panel (BMET)  2. Hyperlipidemia, unspecified hyperlipidemia type: Recheck cholesterol, doing well on Lipitor 40 mg.    - Lipid Profile  3. Acute cough: COVID test today, cough suppressants sent to pharmacy.   - benzonatate (TESSALON) 100 MG capsule; Take 1 capsule (100 mg total) by mouth 2 (two) times daily as needed for cough.  Dispense: 20 capsule; Refill: 0 - Novel Coronavirus, NAA ([facility])  4. Laceration of left lower extremity, sequela: Tdap administered today. Laceration clean, does not appear infected but open, keep area clean and covered.  - Tdap vaccine greater than or equal to 7yo IM   Follow-up: No follow-ups on file.   Margarita Mail, DO

## 2022-10-02 ENCOUNTER — Encounter: Payer: Self-pay | Admitting: Internal Medicine

## 2022-10-02 ENCOUNTER — Ambulatory Visit (INDEPENDENT_AMBULATORY_CARE_PROVIDER_SITE_OTHER): Payer: Medicare Other | Admitting: Internal Medicine

## 2022-10-02 VITALS — BP 146/76 | HR 94 | Temp 97.9°F | Resp 18 | Ht 66.0 in | Wt 170.6 lb

## 2022-10-02 DIAGNOSIS — Z8739 Personal history of other diseases of the musculoskeletal system and connective tissue: Secondary | ICD-10-CM | POA: Diagnosis not present

## 2022-10-02 DIAGNOSIS — E785 Hyperlipidemia, unspecified: Secondary | ICD-10-CM | POA: Diagnosis not present

## 2022-10-02 DIAGNOSIS — I10 Essential (primary) hypertension: Secondary | ICD-10-CM

## 2022-10-04 LAB — RHEUMATOID FACTOR: Rheumatoid fact SerPl-aCnc: 20 IU/mL — ABNORMAL HIGH (ref <14)

## 2022-10-04 LAB — CYCLIC CITRUL PEPTIDE ANTIBODY, IGG: Cyclic Citrullin Peptide Ab: <16 UNITS

## 2022-10-04 LAB — ANTI-NUCLEAR AB-TITER (ANA TITER): ANA Titer 1: 1:40 {titer} — ABNORMAL HIGH

## 2022-10-04 LAB — ANA: Anti Nuclear Antibody (ANA): POSITIVE — AB

## 2022-11-06 ENCOUNTER — Other Ambulatory Visit: Payer: Self-pay | Admitting: Internal Medicine

## 2022-11-06 DIAGNOSIS — I10 Essential (primary) hypertension: Secondary | ICD-10-CM

## 2022-11-07 NOTE — Telephone Encounter (Signed)
Requested Prescriptions  Pending Prescriptions Disp Refills   hydrochlorothiazide (MICROZIDE) 12.5 MG capsule [Pharmacy Med Name: HYDROCHLOROTHIAZIDE CAPS 12.5MG ] 90 capsule 0    Sig: TAKE 1 CAPSULE DAILY     Cardiovascular: Diuretics - Thiazide Failed - 11/06/2022  9:36 AM      Failed - Cr in normal range and within 180 days    Creat  Date Value Ref Range Status  08/23/2022 1.02 (H) 0.60 - 1.00 mg/dL Final         Failed - Last BP in normal range    BP Readings from Last 1 Encounters:  10/02/22 (!) 146/76         Passed - K in normal range and within 180 days    Potassium  Date Value Ref Range Status  08/23/2022 4.8 3.5 - 5.3 mmol/L Final         Passed - Na in normal range and within 180 days    Sodium  Date Value Ref Range Status  08/23/2022 140 135 - 146 mmol/L Final  02/21/2021 140 137 - 147 Final         Passed - Valid encounter within last 6 months    Recent Outpatient Visits           1 month ago History of rheumatoid arthritis   [facility] Margarita Mail, DO   2 months ago Hypertension, unspecified type   [facility] Margarita Mail, DO   10 months ago Hypertension, unspecified type   [facility] Margarita Mail, DO   11 months ago Primary hypertension   [facility] Margarita Mail, DO   1 year ago Hypertension, unspecified type   [facility] Margarita Mail, DO       Future Appointments             In 3 weeks Margarita Mail, DO [facility]             amLODipine (NORVASC) 5 MG tablet [Pharmacy Med Name: AMLODIPINE BESYLATE TABS 5MG ] 90 tablet 0    Sig: TAKE 1 TABLET DAILY     Cardiovascular: Calcium Channel Blockers 2 Failed - 11/06/2022  9:36 AM      Failed - Last BP in normal range    BP Readings from Last 1 Encounters:  10/02/22 (!) 146/76         Passed  - Last Heart Rate in normal range    Pulse Readings from Last 1 Encounters:  10/02/22 94         Passed - Valid encounter within last 6 months    Recent Outpatient Visits           1 month ago History of rheumatoid arthritis   [facility] Margarita Mail, DO   2 months ago Hypertension, unspecified type   [facility] Margarita Mail, DO   10 months ago Hypertension, unspecified type   [facility] Margarita Mail, DO   11 months ago Primary hypertension   [facility] Margarita Mail, DO   1 year ago Hypertension, unspecified type   [facility] Margarita Mail, DO       Future Appointments             In 3 weeks Margarita Mail, DO [facility]  atorvastatin (LIPITOR) 40 MG tablet [Pharmacy Med Name: ATORVASTATIN TABS 40MG ] 90 tablet 0    Sig: TAKE 1 TABLET DAILY     Cardiovascular:  Antilipid - Statins Failed - 11/06/2022  9:36 AM      Failed - Lipid Panel in normal range within the last 12 months    Cholesterol  Date Value Ref Range Status  08/23/2022 160 <200 mg/dL Final   LDL Cholesterol (Calc)  Date Value Ref Range Status  08/23/2022 85 mg/dL (calc) Final    Comment:    Reference range: <100 . Desirable range <100 mg/dL for primary prevention;   <70 mg/dL for patients with CHD or diabetic patients  with > or = 2 CHD risk factors. Marland Kitchen LDL-C is now calculated using the Martin-Hopkins  calculation, which is a validated novel method providing  better accuracy than the Friedewald equation in the  estimation of LDL-C.  Horald Pollen et al. Lenox Ahr. 5621;308(65): 2061-2068  (http://education.[facility].com/faq/FAQ164)    HDL  Date Value Ref Range Status  08/23/2022 51 > OR = 50 mg/dL Final   Triglycerides  Date Value Ref Range Status  08/23/2022 143 <150 mg/dL Final          Passed - Patient is not pregnant      Passed - Valid encounter within last 12 months    Recent Outpatient Visits           1 month ago History of rheumatoid arthritis   [facility] Margarita Mail, DO   2 months ago Hypertension, unspecified type   [facility] Margarita Mail, DO   10 months ago Hypertension, unspecified type   [facility] Margarita Mail, DO   11 months ago Primary hypertension   [facility] Margarita Mail, DO   1 year ago Hypertension, unspecified type   [facility] Margarita Mail, DO       Future Appointments             In 3 weeks Margarita Mail, DO [facility]

## 2022-11-28 NOTE — Progress Notes (Signed)
Established Patient Office Visit  Subjective:  Patient ID: Karen Sims, female    DOB: 1950-09-01  Age: 72 y.o. MRN: [id number]  CC:  Chief Complaint  Patient presents with   Follow-up    HPI Karen Sims presents for follow up.  Hypertension: -Medications: HCTZ 12.5 mg (increased to 25 mg but caused raise in creatinine), Amlodipine 5 mg  -Failed Meds: Losartan - saw Dermatology in the meantime who thinks her rash on her legs is due to the Losartan, Lisinopril caused cough -Patient is compliant with above medications and reports no side effects. -Checking BP at home (average): 130/80 -Denies any SOB, CP, vision changes, LE edema or symptoms of hypotension. Occasional headaches when BP is high, had one a few weeks ago. Headaches resolved.   HLD: -Medications: Lipitor 40 mg -Patient is compliant with above medications and reports no side effects.  -Last lipid panel: Lipid Panel     Component Value Date/Time   CHOL 160 08/23/2022 1038   TRIG 143 08/23/2022 1038   HDL 51 08/23/2022 1038   CHOLHDL 3.1 08/23/2022 1038   LDLCALC 85 08/23/2022 1038   RA: -Faintly positive ANA and RA on labs in May -No symptoms, not on any medications   Health Maintenance: -Blood work UTD -Breast cancer screening: Mammogram ordered -Colon cancer screening: colonoscopy 2/24, repeat in 10 years  Past Medical History:  Diagnosis Date   Hyperlipidemia    Hypertension    RA (rheumatoid arthritis) (HCC)     Past Surgical History:  Procedure Laterality Date   ABDOMINAL HYSTERECTOMY     AUGMENTATION MAMMAPLASTY     2002   COLONOSCOPY WITH PROPOFOL N/A 07/18/2022   Procedure: COLONOSCOPY WITH PROPOFOL;  Surgeon: Wyline Mood, MD;  Location: [facility] ENDOSCOPY;  Service: Gastroenterology;  Laterality: N/A;   TUBAL LIGATION      Family History  Problem Relation Age of Onset   Cancer Mother        colon   Hypertension Father    Hyperlipidemia Father    Stroke Father    Kidney  disease Father     Social History   Socioeconomic History   Marital status: Married    Spouse name: Not on file   Number of children: Not on file   Years of education: Not on file   Highest education level: 12th grade  Occupational History   Not on file  Tobacco Use   Smoking status: Never   Smokeless tobacco: Never  Vaping Use   Vaping Use: Never used  Substance and Sexual Activity   Alcohol use: Yes    Alcohol/week: 6.0 standard drinks of alcohol    Types: 6 Glasses of wine per week   Drug use: Never   Sexual activity: Not Currently  Other Topics Concern   Not on file  Social History Narrative   Not on file   Social Determinants of Health   Financial Resource Strain: Low Risk  (08/19/2022)   Overall Financial Resource Strain (CARDIA)    Difficulty of Paying Living Expenses: Not hard at all  Food Insecurity: No Food Insecurity (08/19/2022)   Hunger Vital Sign    Worried About Running Out of Food in the Last Year: Never true    Ran Out of Food in the Last Year: Never true  Transportation Needs: No Transportation Needs (08/19/2022)   PRAPARE - Administrator, Civil Service (Medical): No    Lack of Transportation (Non-Medical): No  Physical Activity: Inactive (08/19/2022)  Exercise Vital Sign    Days of Exercise per Week: 0 days    Minutes of Exercise per Session: 0 min  Stress: No Stress Concern Present (08/19/2022)   [employer] of Occupational Health - Occupational Stress Questionnaire    Feeling of Stress : Not at all  Social Connections: Moderately Isolated (08/19/2022)   Social Connection and Isolation Panel [NHANES]    Frequency of Communication with Friends and Family: Three times a week    Frequency of Social Gatherings with Friends and Family: Never    Attends Religious Services: Never    Database administrator or Organizations: No    Attends Banker Meetings: Never    Marital Status: Married  Catering manager Violence: Not At  Risk (06/28/2022)   Humiliation, Afraid, Rape, and Kick questionnaire    Fear of Current or Ex-Partner: No    Emotionally Abused: No    Physically Abused: No    Sexually Abused: No    ROS Review of Systems  Constitutional:  Negative for chills and fever.  Eyes:  Negative for visual disturbance.  Respiratory:  Negative for cough and shortness of breath.   Cardiovascular:  Negative for chest pain.  Gastrointestinal:  Negative for abdominal pain.  Musculoskeletal:  Negative for arthralgias and joint swelling.  Skin:  Positive for color change.  Neurological:  Negative for dizziness.    Objective:   Today's Vitals: BP 120/68   Pulse 85   Temp 97.9 F (36.6 C)   Resp 18   Ht 5\' 6"  (1.676 m)   Wt 170 lb 4.8 oz (77.2 kg)   SpO2 96%   BMI 27.49 kg/m   Physical Exam Constitutional:      Appearance: Normal appearance.  HENT:     Head: Normocephalic and atraumatic.  Eyes:     Conjunctiva/sclera: Conjunctivae normal.  Cardiovascular:     Rate and Rhythm: Normal rate and regular rhythm.  Pulmonary:     Effort: Pulmonary effort is normal.     Breath sounds: Normal breath sounds.  Musculoskeletal:     Right lower leg: No edema.     Left lower leg: No edema.  Skin:    General: Skin is warm and dry.     Comments: Leg calf still with opens lesions, does not appear to be infected  Neurological:     General: No focal deficit present.     Mental Status: She is alert. Mental status is at baseline.  Psychiatric:        Mood and Affect: Mood normal.        Behavior: Behavior normal.     Assessment & Plan:   1. Hypertension, unspecified type: Controlled, continue Hydrochlorothiazide 12.5 mg and Amlodipine 5 mg.   2. Hyperlipidemia, unspecified hyperlipidemia type: Reviewed most recent lipid panel with the patient, controlled. Continue Lipitor 40 mg.  3. Rheumatoid factor positive: No symptoms, check labs for insurance purposes but labs in May with faintly positive ANA, RF.  Continue to monitor.   4. Skin lesion of left leg: Uncertain diagnosis, appears to be open non-healing wounds on legs. Referral to Dermatology for possible biopsy.  - Ambulatory referral to Dermatology  5. Encounter for screening mammogram for malignant neoplasm of breast: Mammogram ordered.   - MM 3D SCREENING MAMMOGRAM BILATERAL BREAST; Future   Follow-up: Return in about 6 months (around 06/01/2023).   Margarita Mail, DO

## 2022-11-29 ENCOUNTER — Ambulatory Visit (INDEPENDENT_AMBULATORY_CARE_PROVIDER_SITE_OTHER): Payer: Medicare Other | Admitting: Internal Medicine

## 2022-11-29 ENCOUNTER — Encounter: Payer: Self-pay | Admitting: Internal Medicine

## 2022-11-29 VITALS — BP 120/68 | HR 85 | Temp 97.9°F | Resp 18 | Ht 66.0 in | Wt 170.3 lb

## 2022-11-29 DIAGNOSIS — E785 Hyperlipidemia, unspecified: Secondary | ICD-10-CM

## 2022-11-29 DIAGNOSIS — R768 Other specified abnormal immunological findings in serum: Secondary | ICD-10-CM

## 2022-11-29 DIAGNOSIS — Z1231 Encounter for screening mammogram for malignant neoplasm of breast: Secondary | ICD-10-CM | POA: Diagnosis not present

## 2022-11-29 DIAGNOSIS — L989 Disorder of the skin and subcutaneous tissue, unspecified: Secondary | ICD-10-CM

## 2022-11-29 DIAGNOSIS — I10 Essential (primary) hypertension: Secondary | ICD-10-CM | POA: Diagnosis not present

## 2023-01-29 DIAGNOSIS — Z23 Encounter for immunization: Secondary | ICD-10-CM | POA: Diagnosis not present

## 2023-02-03 ENCOUNTER — Other Ambulatory Visit: Payer: Self-pay | Admitting: Internal Medicine

## 2023-02-04 NOTE — Telephone Encounter (Signed)
Requested Prescriptions  Pending Prescriptions Disp Refills   atorvastatin (LIPITOR) 40 MG tablet [Pharmacy Med Name: ATORVASTATIN TABS 40MG ] 90 tablet 1    Sig: TAKE 1 TABLET DAILY     Cardiovascular:  Antilipid - Statins Failed - 02/03/2023 12:40 AM      Failed - Lipid Panel in normal range within the last 12 months    Cholesterol  Date Value Ref Range Status  08/23/2022 160 <200 mg/dL Final   LDL Cholesterol (Calc)  Date Value Ref Range Status  08/23/2022 85 mg/dL (calc) Final    Comment:    Reference range: <100 . Desirable range <100 mg/dL for primary prevention;   <70 mg/dL for patients with CHD or diabetic patients  with > or = 2 CHD risk factors. Marland Kitchen LDL-C is now calculated using the Martin-Hopkins  calculation, which is a validated novel method providing  better accuracy than the Friedewald equation in the  estimation of LDL-C.  Horald Pollen et al. Lenox Ahr. 1610;960(45): 2061-2068  (http://education.[facility].com/faq/FAQ164)    HDL  Date Value Ref Range Status  08/23/2022 51 > OR = 50 mg/dL Final   Triglycerides  Date Value Ref Range Status  08/23/2022 143 <150 mg/dL Final         Passed - Patient is not pregnant      Passed - Valid encounter within last 12 months    Recent Outpatient Visits           2 months ago Hypertension, unspecified type   [facility] Margarita Mail, DO   4 months ago History of rheumatoid arthritis   [facility] Margarita Mail, DO   5 months ago Hypertension, unspecified type   [facility] Margarita Mail, DO   1 year ago Hypertension, unspecified type   [facility] Margarita Mail, DO   1 year ago Primary hypertension   [facility] Margarita Mail, DO       Future Appointments             In 4 months Margarita Mail, DO [facility], [facility]

## 2023-02-13 ENCOUNTER — Other Ambulatory Visit: Payer: Self-pay | Admitting: Internal Medicine

## 2023-02-13 DIAGNOSIS — I10 Essential (primary) hypertension: Secondary | ICD-10-CM

## 2023-02-17 NOTE — Telephone Encounter (Signed)
Labs in date.  Has appt for Jan. 2025.  Requested Prescriptions  Pending Prescriptions Disp Refills   hydrochlorothiazide (MICROZIDE) 12.5 MG capsule [Pharmacy Med Name: HYDROCHLOROTHIAZIDE CAPS 12.5MG ] 90 capsule 1    Sig: TAKE 1 CAPSULE DAILY     Cardiovascular: Diuretics - Thiazide Failed - 02/13/2023  2:28 PM      Failed - Cr in normal range and within 180 days    Creat  Date Value Ref Range Status  08/23/2022 1.02 (H) 0.60 - 1.00 mg/dL Final         Passed - K in normal range and within 180 days    Potassium  Date Value Ref Range Status  08/23/2022 4.8 3.5 - 5.3 mmol/L Final         Passed - Na in normal range and within 180 days    Sodium  Date Value Ref Range Status  08/23/2022 140 135 - 146 mmol/L Final  02/21/2021 140 137 - 147 Final         Passed - Last BP in normal range    BP Readings from Last 1 Encounters:  11/29/22 120/68         Passed - Valid encounter within last 6 months    Recent Outpatient Visits           2 months ago Hypertension, unspecified type   [facility] Margarita Mail, DO   4 months ago History of rheumatoid arthritis   [facility] Margarita Mail, DO   5 months ago Hypertension, unspecified type   [facility] Margarita Mail, DO   1 year ago Hypertension, unspecified type   [facility] Margarita Mail, DO   1 year ago Primary hypertension   [facility] Margarita Mail, DO       Future Appointments             In 3 months Margarita Mail, DO [place] [facility]             amLODipine (NORVASC) 5 MG tablet [Pharmacy Med Name: AMLODIPINE BESYLATE TABS 5MG ] 90 tablet 1    Sig: TAKE 1 TABLET DAILY     Cardiovascular: Calcium Channel Blockers 2 Passed - 02/13/2023  2:28 PM      Passed - Last BP in normal range    BP Readings from Last 1 Encounters:   11/29/22 120/68         Passed - Last Heart Rate in normal range    Pulse Readings from Last 1 Encounters:  11/29/22 85         Passed - Valid encounter within last 6 months    Recent Outpatient Visits           2 months ago Hypertension, unspecified type   [facility] Margarita Mail, DO   4 months ago History of rheumatoid arthritis   [facility] Margarita Mail, DO   5 months ago Hypertension, unspecified type   [facility] Margarita Mail, DO   1 year ago Hypertension, unspecified type   [facility] Margarita Mail, DO   1 year ago Primary hypertension   [facility] Margarita Mail, DO       Future Appointments             In 3 months Margarita Mail, DO [facility], [facility]

## 2023-02-25 ENCOUNTER — Other Ambulatory Visit: Payer: Self-pay | Admitting: Internal Medicine

## 2023-02-25 DIAGNOSIS — Z1231 Encounter for screening mammogram for malignant neoplasm of breast: Secondary | ICD-10-CM

## 2023-02-28 ENCOUNTER — Ambulatory Visit
Admission: RE | Admit: 2023-02-28 | Discharge: 2023-02-28 | Disposition: A | Discharge status: Home / Self Care | Payer: Medicare Other | Source: Ambulatory Visit | Attending: Internal Medicine | Admitting: Internal Medicine

## 2023-02-28 DIAGNOSIS — Z1231 Encounter for screening mammogram for malignant neoplasm of breast: Secondary | ICD-10-CM | POA: Diagnosis not present

## 2023-06-06 ENCOUNTER — Other Ambulatory Visit: Payer: Self-pay

## 2023-06-06 ENCOUNTER — Ambulatory Visit (INDEPENDENT_AMBULATORY_CARE_PROVIDER_SITE_OTHER): Payer: Medicare Other | Admitting: Internal Medicine

## 2023-06-06 ENCOUNTER — Encounter: Payer: Self-pay | Admitting: Internal Medicine

## 2023-06-06 VITALS — BP 128/84 | HR 72 | Temp 97.8°F | Resp 16 | Ht 69.0 in | Wt 149.9 lb

## 2023-06-06 DIAGNOSIS — M069 Rheumatoid arthritis, unspecified: Secondary | ICD-10-CM

## 2023-06-06 DIAGNOSIS — I10 Essential (primary) hypertension: Secondary | ICD-10-CM | POA: Diagnosis not present

## 2023-06-06 DIAGNOSIS — E785 Hyperlipidemia, unspecified: Secondary | ICD-10-CM

## 2023-06-06 DIAGNOSIS — R590 Localized enlarged lymph nodes: Secondary | ICD-10-CM

## 2023-06-06 NOTE — Assessment & Plan Note (Signed)
 Blood pressure stable here today, no changes made to medications and appropriate refills sent to pharmacy.

## 2023-06-06 NOTE — Assessment & Plan Note (Signed)
 Stable, continue statin. Plan to recheck fasting labs at follow up.

## 2023-06-06 NOTE — Assessment & Plan Note (Signed)
 Labs mildly positive last May, not on medication and no symptoms currently, will continue to monitor.

## 2023-06-06 NOTE — Progress Notes (Signed)
 Established Patient Office Visit  Subjective   Patient ID: Karen Sims, female    DOB: 1950/11/10  Age: 73 y.o. MRN: [id number]  Chief Complaint  Patient presents with   Medical Management of Chronic Issues    6 month follow up    HPI  Karen Sims presents for follow up. She is doing really well and has lost 20 pounds since our LOV by cutting out alcoholic beverages.   Hypertension: -Medications: HCTZ 12.5 mg (increased to 25 mg but caused raise in creatinine), Amlodipine  5 mg  -Failed Meds: Losartan  due to rash -Patient is compliant with above medications and reports no side effects. -Checking BP at home (average): 120/80 -Denies any SOB, CP, vision changes, LE edema or symptoms of hypotension. Occasional headaches when BP is high, had one a few weeks ago. Headaches resolved.   HLD: -Medications: Lipitor 40 mg -Patient is compliant with above medications and reports no side effects.  -Last lipid panel: Lipid Panel     Component Value Date/Time   CHOL 160 08/23/2022 1038   TRIG 143 08/23/2022 1038   HDL 51 08/23/2022 1038   CHOLHDL 3.1 08/23/2022 1038   LDLCALC 85 08/23/2022 1038    RA: -Faintly positive ANA and RA on labs in May 2024 -No symptoms, not on any medications   Health Maintenance: -Blood work UTD -Breast cancer screening: Mammogram 10/24 Birads-1 -Colon cancer screening: colonoscopy 2/24, repeat in 10 years -Shingles vaccine done at the pharmacy   Patient Active Problem List   Diagnosis Date Noted   Family history of colon cancer in mother 07/18/2022   Adenomatous polyp of colon 07/18/2022   HTN (hypertension) 06/05/2021   Hyperlipidemia 06/05/2021   RA (rheumatoid arthritis) (HCC) 06/05/2021   Past Medical History:  Diagnosis Date   Hyperlipidemia    Hypertension    RA (rheumatoid arthritis) (HCC)    Past Surgical History:  Procedure Laterality Date   ABDOMINAL HYSTERECTOMY     AUGMENTATION MAMMAPLASTY     2002   COLONOSCOPY  WITH PROPOFOL  N/A 07/18/2022   Procedure: COLONOSCOPY WITH PROPOFOL ;  Surgeon: Therisa Bi, MD;  Location: [facility] ENDOSCOPY;  Service: Gastroenterology;  Laterality: N/A;   TUBAL LIGATION     Social History   Tobacco Use   Smoking status: Never   Smokeless tobacco: Never  Vaping Use   Vaping status: Never Used  Substance Use Topics   Alcohol use: Yes    Alcohol/week: 6.0 standard drinks of alcohol    Types: 6 Glasses of wine per week   Drug use: Never   Social History   Socioeconomic History   Marital status: Married    Spouse name: Not on file   Number of children: Not on file   Years of education: Not on file   Highest education level: Some college, no degree  Occupational History   Not on file  Tobacco Use   Smoking status: Never   Smokeless tobacco: Never  Vaping Use   Vaping status: Never Used  Substance and Sexual Activity   Alcohol use: Yes    Alcohol/week: 6.0 standard drinks of alcohol    Types: 6 Glasses of wine per week   Drug use: Never   Sexual activity: Not Currently  Other Topics Concern   Not on file  Social History Narrative   Not on file   Social Drivers of Health   Financial Resource Strain: Low Risk  (06/02/2023)   Overall Financial Resource Strain (CARDIA)  Difficulty of Paying Living Expenses: Not hard at all  Food Insecurity: No Food Insecurity (06/02/2023)   Hunger Vital Sign    Worried About Running Out of Food in the Last Year: Never true    Ran Out of Food in the Last Year: Never true  Transportation Needs: No Transportation Needs (06/02/2023)   PRAPARE - Administrator, Civil Service (Medical): No    Lack of Transportation (Non-Medical): No  Physical Activity: Inactive (06/02/2023)   Exercise Vital Sign    Days of Exercise per Week: 0 days    Minutes of Exercise per Session: 0 min  Stress: No Stress Concern Present (06/02/2023)   [employer] of Occupational Health - Occupational Stress Questionnaire    Feeling of Stress :  Not at all  Social Connections: Socially Isolated (06/02/2023)   Social Connection and Isolation Panel [NHANES]    Frequency of Communication with Friends and Family: Twice a week    Frequency of Social Gatherings with Friends and Family: Never    Attends Religious Services: Never    Database Administrator or Organizations: No    Attends Banker Meetings: Never    Marital Status: Married  Catering Manager Violence: Not At Risk (06/28/2022)   Humiliation, Afraid, Rape, and Kick questionnaire    Fear of Current or Ex-Partner: No    Emotionally Abused: No    Physically Abused: No    Sexually Abused: No   Family Status  Relation Name Status   Mother  Deceased   Father  Deceased   Son 1 Alive   Neg Hx  (Not Specified)  No partnership data on file   Family History  Problem Relation Age of Onset   Cancer Mother        colon   Hypertension Father    Hyperlipidemia Father    Stroke Father    Kidney disease Father    Breast cancer Neg Hx    Allergies  Allergen Reactions   Aspirin Other (See Comments)    Upset stomach   Codeine Other (See Comments)    Nervous      Review of Systems  All other systems reviewed and are negative.     Objective:     BP 128/84 (Cuff Size: Normal)   Pulse 72   Temp 97.8 F (36.6 C) (Oral)   Resp 16   Ht 5' 9 (1.753 m)   Wt 149 lb 14.4 oz (68 kg)   SpO2 98%   BMI 22.14 kg/m  BP Readings from Last 3 Encounters:  06/06/23 128/84  11/29/22 120/68  10/02/22 (!) 146/76   Wt Readings from Last 3 Encounters:  06/06/23 149 lb 14.4 oz (68 kg)  11/29/22 170 lb 4.8 oz (77.2 kg)  10/02/22 170 lb 9.6 oz (77.4 kg)      Physical Exam Constitutional:      Appearance: Normal appearance.  HENT:     Head: Normocephalic and atraumatic.     Mouth/Throat:     Mouth: Mucous membranes are moist.     Pharynx: Oropharynx is clear.  Eyes:     Extraocular Movements: Extraocular movements intact.     Conjunctiva/sclera: Conjunctivae  normal.     Pupils: Pupils are equal, round, and reactive to light.  Neck:     Comments: Mildly enlarged left anterior cervical lymph node Cardiovascular:     Rate and Rhythm: Normal rate and regular rhythm.  Pulmonary:     Effort: Pulmonary effort is  normal.     Breath sounds: Normal breath sounds.  Musculoskeletal:     Cervical back: No tenderness.     Right lower leg: No edema.     Left lower leg: No edema.  Lymphadenopathy:     Cervical: No cervical adenopathy.  Skin:    General: Skin is warm and dry.  Neurological:     General: No focal deficit present.     Mental Status: She is alert. Mental status is at baseline.  Psychiatric:        Mood and Affect: Mood normal.        Behavior: Behavior normal.      No results found for any visits on 06/06/23.  Last CBC Lab Results  Component Value Date   WBC 6.5 01/10/2022   HGB 12.3 01/10/2022   HCT 36.3 01/10/2022   MCV 91.4 01/10/2022   MCH 31.0 01/10/2022   RDW 12.4 01/10/2022   PLT 266 01/10/2022   Last metabolic panel Lab Results  Component Value Date   GLUCOSE 90 08/23/2022   NA 140 08/23/2022   K 4.8 08/23/2022   CL 101 08/23/2022   CO2 29 08/23/2022   BUN 21 08/23/2022   CREATININE 1.02 (H) 08/23/2022   EGFR 48 (L) 01/10/2022   CALCIUM  9.6 08/23/2022   PROT 7.4 01/10/2022   BILITOT 0.4 01/10/2022   ALKPHOS 87 02/21/2021   AST 20 01/10/2022   ALT 20 01/10/2022   Last lipids Lab Results  Component Value Date   CHOL 160 08/23/2022   HDL 51 08/23/2022   LDLCALC 85 08/23/2022   TRIG 143 08/23/2022   CHOLHDL 3.1 08/23/2022   Last hemoglobin A1c Lab Results  Component Value Date   HGBA1C 5.6 02/21/2021   Last thyroid  functions Lab Results  Component Value Date   TSH 1.47 01/10/2022   Last vitamin D  Lab Results  Component Value Date   VD25OH 138 02/21/2021   Last vitamin B12 and Folate No results found for: VITAMINB12, FOLATE    The 10-year ASCVD risk score (Arnett DK, et al., 2019)  is: 26.9%    Assessment & Plan:  Primary hypertension Assessment & Plan: Blood pressure stable here today, no changes made to medications and appropriate refills sent to pharmacy.     Hyperlipidemia, unspecified hyperlipidemia type Assessment & Plan: Stable, continue statin. Plan to recheck fasting labs at follow up.    Rheumatoid arthritis involving multiple sites, unspecified whether rheumatoid factor present [facility]) Assessment & Plan: Labs mildly positive last May, not on medication and no symptoms currently, will continue to monitor.    Lymphadenopathy, anterior cervical  Patient was just sick with a cold last week, thinking lymph node is reactive but will recheck at follow up and if still present will obtain an US .  Return in about 6 months (around 12/04/2023).    Sharyle Fischer, DO

## 2023-07-17 ENCOUNTER — Ambulatory Visit: Payer: Medicare Other

## 2023-07-17 DIAGNOSIS — Z0000 Encounter for general adult medical examination without abnormal findings: Secondary | ICD-10-CM | POA: Diagnosis not present

## 2023-07-17 NOTE — Progress Notes (Signed)
 Subjective:   Karen Sims is a 73 y.o. who presents for a Medicare Wellness preventive visit.  Visit Complete: Virtual I connected with  Karen Sims on 07/17/23 by a audio enabled telemedicine application and verified that I am speaking with the correct person using two identifiers.  Patient Location: Home  Provider Location: Office/Clinic  I discussed the limitations of evaluation and management by telemedicine. The patient expressed understanding and agreed to proceed.  Vital Signs: Because this visit was a virtual/telehealth visit, some criteria may be missing or patient reported. Any vitals not documented were not able to be obtained and vitals that have been documented are patient reported.  VideoDeclined- This patient declined Librarian, academic. Therefore the visit was completed with audio only.  AWV Questionnaire: No: Patient Medicare AWV questionnaire was not completed prior to this visit.  Cardiac Risk Factors include: advanced age (>81men, >25 women);hypertension;dyslipidemia;sedentary lifestyle     Objective:    There were no vitals filed for this visit. There is no height or weight on file to calculate BMI.     07/17/2023   11:38 AM 06/28/2022    3:28 PM  Advanced Directives  Does Patient Have a Medical Advance Directive? No No  Would patient like information on creating a medical advance directive? No - Patient declined No - Patient declined    Current Medications (verified) Outpatient Encounter Medications as of 07/17/2023  Medication Sig   amLODipine (NORVASC) 5 MG tablet TAKE 1 TABLET DAILY   atorvastatin (LIPITOR) 40 MG tablet TAKE 1 TABLET DAILY   folic acid (FOLVITE) 1 MG tablet Take 1 mg by mouth daily.   hydrochlorothiazide (MICROZIDE) 12.5 MG capsule TAKE 1 CAPSULE DAILY   Turmeric (QC TUMERIC COMPLEX PO) Take by mouth daily.   VITAMIN D PO Take by mouth daily.   No facility-administered encounter medications on  file as of 07/17/2023.    Allergies (verified) Aspirin and Codeine   History: Past Medical History:  Diagnosis Date   Hyperlipidemia    Hypertension    RA (rheumatoid arthritis) (HCC)    Past Surgical History:  Procedure Laterality Date   ABDOMINAL HYSTERECTOMY     AUGMENTATION MAMMAPLASTY     2002   COLONOSCOPY WITH PROPOFOL N/A 07/18/2022   Procedure: COLONOSCOPY WITH PROPOFOL;  Surgeon: Wyline Mood, MD;  Location: [facility] ENDOSCOPY;  Service: Gastroenterology;  Laterality: N/A;   TUBAL LIGATION     Family History  Problem Relation Age of Onset   Cancer Mother        colon   Hypertension Father    Hyperlipidemia Father    Stroke Father    Kidney disease Father    Breast cancer Neg Hx    Social History   Socioeconomic History   Marital status: Married    Spouse name: Not on file   Number of children: Not on file   Years of education: Not on file   Highest education level: Some college, no degree  Occupational History   Not on file  Tobacco Use   Smoking status: Never   Smokeless tobacco: Never  Vaping Use   Vaping status: Never Used  Substance and Sexual Activity   Alcohol use: Yes    Alcohol/week: 6.0 standard drinks of alcohol    Types: 6 Glasses of wine per week   Drug use: Never   Sexual activity: Not Currently  Other Topics Concern   Not on file  Social History Narrative   Not on  file   Social Drivers of Health   Financial Resource Strain: Low Risk  (07/17/2023)   Overall Financial Resource Strain (CARDIA)    Difficulty of Paying Living Expenses: Not hard at all  Food Insecurity: No Food Insecurity (07/17/2023)   Hunger Vital Sign    Worried About Running Out of Food in the Last Year: Never true    Ran Out of Food in the Last Year: Never true  Transportation Needs: No Transportation Needs (07/17/2023)   PRAPARE - Administrator, Civil Service (Medical): No    Lack of Transportation (Non-Medical): No  Physical Activity: Inactive  (07/17/2023)   Exercise Vital Sign    Days of Exercise per Week: 0 days    Minutes of Exercise per Session: 0 min  Stress: No Stress Concern Present (07/17/2023)   [employer] of Occupational Health - Occupational Stress Questionnaire    Feeling of Stress : Not at all  Social Connections: Moderately Isolated (07/17/2023)   Social Connection and Isolation Panel [NHANES]    Frequency of Communication with Friends and Family: More than three times a week    Frequency of Social Gatherings with Friends and Family: Never    Attends Religious Services: Never    Database administrator or Organizations: No    Attends Engineer, structural: Never    Marital Status: Married    Tobacco Counseling Counseling given: Not Answered    Clinical Intake:  Pre-visit preparation completed: Yes  Pain : No/denies pain     BMI - recorded: 22 Nutritional Status: BMI of 19-24  Normal Nutritional Risks: None Diabetes: No  How often do you need to have someone help you when you read instructions, pamphlets, or other written materials from your doctor or pharmacy?: 1 - Never  Interpreter Needed?: No  Information entered by :: Kennedy Bucker, LPN   Activities of Daily Living     07/17/2023   11:39 AM 07/17/2023   10:28 AM  In your present state of health, do you have any difficulty performing the following activities:  Hearing? 0 0  Vision? 0 0  Difficulty concentrating or making decisions? 0 0  Walking or climbing stairs? 0 0  Dressing or bathing? 0 0  Doing errands, shopping? 0 0  Preparing Food and eating ? N N  Using the Toilet? N N  In the past six months, have you accidently leaked urine? N N  Do you have problems with loss of bowel control? N N  Managing your Medications? N N  Managing your Finances? N N  Housekeeping or managing your Housekeeping? N N    Patient Care Team: Margarita Mail, DO as PCP - General (Internal Medicine)  Indicate any recent Medical  Services you may have received from other than [facility] providers in the past year (date may be approximate).     Assessment:   This is a routine wellness examination for Karen Sims.  Hearing/Vision screen Hearing Screening - Comments:: NO AIDS Vision Screening - Comments:: NO GLASSES   Goals Addressed             This Visit's Progress    DIET - EAT MORE FRUITS AND VEGETABLES         Depression Screen     07/17/2023   11:36 AM 06/06/2023    9:46 AM 08/23/2022   10:11 AM 06/28/2022    3:23 PM 01/10/2022    9:04 AM 12/06/2021    9:09 AM 06/05/2021  9:42 AM  PHQ 2/9 Scores  PHQ - 2 Score 0 0 0 0 0 0 0  PHQ- 9 Score 0  0  0 0 0    Fall Risk     07/17/2023   11:38 AM 07/17/2023   10:28 AM 06/06/2023    9:46 AM 08/23/2022   10:11 AM 06/28/2022    3:19 PM  Fall Risk   Falls in the past year? 0 0 0 0 0  Number falls in past yr: 0  0 0 0  Injury with Fall? 0  0 0 0  Risk for fall due to : No Fall Risks  No Fall Risks  No Fall Risks  Follow up Falls prevention discussed;Falls evaluation completed  Falls evaluation completed  Education provided;Falls prevention discussed    MEDICARE RISK AT HOME:  Medicare Risk at Home Any stairs in or around the home?: Yes If so, are there any without handrails?: No Home free of loose throw rugs in walkways, pet beds, electrical cords, etc?: Yes Adequate lighting in your home to reduce risk of falls?: Yes Life alert?: No Use of a cane, walker or w/c?: No Grab bars in the bathroom?: No Shower chair or bench in shower?: Yes Elevated toilet seat or a handicapped toilet?: No  TIMED UP AND GO:  Was the test performed?  No  Cognitive Function: 6CIT completed        07/17/2023   11:40 AM 06/28/2022    3:32 PM  6CIT Screen  What Year? 0 points 0 points  What month? 0 points 0 points  What time? 0 points 0 points  Count back from 20 0 points 0 points  Months in reverse 0 points 0 points  Repeat phrase 0 points 2 points  Total Score 0 points 2  points    Immunizations Immunization History  Administered Date(s) Administered   Fluad Quad(high Dose 65+) 03/06/2023   Influenza Inj Mdck Quad Pf 04/02/2018   Influenza-Unspecified 04/02/2018   Moderna SARS-COV2 Booster Vaccination 04/13/2020   Moderna Sars-Covid-2 Vaccination 07/03/2019, 08/03/2019, 04/07/2021   PNEUMOCOCCAL CONJUGATE-20 12/06/2021, 01/29/2023   Pneumococcal Conjugate-13 11/26/2016   Tdap 08/23/2022    Screening Tests Health Maintenance  Topic Date Due   Zoster Vaccines- Shingrix (1 of 2) Never done   COVID-19 Vaccine (4 - 2024-25 season) 01/26/2023   Medicare Annual Wellness (AWV)  07/16/2024   MAMMOGRAM  02/27/2025   DEXA SCAN  08/25/2025   Colonoscopy  07/18/2032   DTaP/Tdap/Td (2 - Td or Tdap) 08/22/2032   Pneumonia Vaccine 44+ Years old  Completed   INFLUENZA VACCINE  Completed   Hepatitis C Screening  Completed   HPV VACCINES  Aged Out    Health Maintenance  Health Maintenance Due  Topic Date Due   Zoster Vaccines- Shingrix (1 of 2) Never done   COVID-19 Vaccine (4 - 2024-25 season) 01/26/2023   Health Maintenance Items Addressed: UP TO DATE  Additional Screening:  Vision Screening: Recommended annual ophthalmology exams for early detection of glaucoma and other disorders of the eye.  Dental Screening: Recommended annual dental exams for proper oral hygiene  Community Resource Referral / Chronic Care Management: CRR required this visit?  No   CCM required this visit?  No     Plan:     I have personally reviewed and noted the following in the patient's chart:   Medical and social history Use of alcohol, tobacco or illicit drugs  Current medications and supplements including opioid prescriptions. Patient  is not currently taking opioid prescriptions. Functional ability and status Nutritional status Physical activity Advanced directives List of other physicians Hospitalizations, surgeries, and ER visits in previous 12  months Vitals Screenings to include cognitive, depression, and falls Referrals and appointments  In addition, I have reviewed and discussed with patient certain preventive protocols, quality metrics, and best practice recommendations. A written personalized care plan for preventive services as well as general preventive health recommendations were provided to patient.     Hal Hope, LPN   09/02/8117   After Visit Summary: (MyChart) Due to this being a telephonic visit, the after visit summary with patients personalized plan was offered to patient via MyChart   Notes: Nothing significant to report at this time.

## 2023-07-17 NOTE — Patient Instructions (Addendum)
 Karen Sims , Thank you for taking time to come for your Medicare Wellness Visit. I appreciate your ongoing commitment to your health goals. Please review the following plan we discussed and let me know if I can assist you in the future.   Referrals/Orders/Follow-Ups/Clinician Recommendations: NONE  This is a list of the screening recommended for you and due dates:  Health Maintenance  Topic Date Due   Zoster (Shingles) Vaccine (1 of 2) Never done   COVID-19 Vaccine (4 - 2024-25 season) 01/26/2023   Medicare Annual Wellness Visit  07/16/2024   Mammogram  02/27/2025   DEXA scan (bone density measurement)  08/25/2025   Colon Cancer Screening  07/18/2032   DTaP/Tdap/Td vaccine (2 - Td or Tdap) 08/22/2032   Pneumonia Vaccine  Completed   Flu Shot  Completed   Hepatitis C Screening  Completed   HPV Vaccine  Aged Out    Advanced directives: (ACP Link)Information on Advanced Care Planning can be found at [facility] of [place] Advance Health Care Directives Advance Health Care Directives ([email or web address]   Next Medicare Annual Wellness Visit scheduled for next year: Yes   07/23/23 @ 10:50 AM BY PHONE

## 2023-07-24 ENCOUNTER — Other Ambulatory Visit: Payer: Self-pay | Admitting: Internal Medicine

## 2023-07-24 DIAGNOSIS — I10 Essential (primary) hypertension: Secondary | ICD-10-CM

## 2023-07-24 NOTE — Telephone Encounter (Signed)
 Requested Prescriptions  Pending Prescriptions Disp Refills   amLODipine (NORVASC) 5 MG tablet [Pharmacy Med Name: AMLODIPINE BESYLATE TABS 5MG ] 90 tablet 1    Sig: TAKE 1 TABLET DAILY     Cardiovascular: Calcium Channel Blockers 2 Passed - 07/24/2023  3:43 PM      Passed - Last BP in normal range    BP Readings from Last 1 Encounters:  06/06/23 128/84         Passed - Last Heart Rate in normal range    Pulse Readings from Last 1 Encounters:  06/06/23 72         Passed - Valid encounter within last 6 months    Recent Outpatient Visits           1 month ago Primary hypertension   [place] [facility] Margarita Mail, DO   7 months ago Hypertension, unspecified type   [facility] Margarita Mail, DO   9 months ago History of rheumatoid arthritis   [facility] Margarita Mail, DO   11 months ago Hypertension, unspecified type   [facility] Margarita Mail, DO   1 year ago Hypertension, unspecified type   [facility] Margarita Mail, DO       Future Appointments             In 4 months Margarita Mail, DO [facility], [facility]             hydrochlorothiazide (MICROZIDE) 12.5 MG capsule [Pharmacy Med Name: HYDROCHLOROTHIAZIDE CAPS 12.5MG ] 90 capsule 1    Sig: TAKE 1 CAPSULE DAILY     Cardiovascular: Diuretics - Thiazide Failed - 07/24/2023  3:43 PM      Failed - Cr in normal range and within 180 days    Creat  Date Value Ref Range Status  08/23/2022 1.02 (H) 0.60 - 1.00 mg/dL Final         Failed - K in normal range and within 180 days    Potassium  Date Value Ref Range Status  08/23/2022 4.8 3.5 - 5.3 mmol/L Final         Failed - Na in normal range and within 180 days    Sodium  Date Value Ref Range Status  08/23/2022 140 135 - 146 mmol/L Final  02/21/2021 140 137 - 147 Final         Passed  - Last BP in normal range    BP Readings from Last 1 Encounters:  06/06/23 128/84         Passed - Valid encounter within last 6 months    Recent Outpatient Visits           1 month ago Primary hypertension   [facility] Margarita Mail, DO   7 months ago Hypertension, unspecified type   [facility] Margarita Mail, DO   9 months ago History of rheumatoid arthritis   [facility] Margarita Mail, DO   11 months ago Hypertension, unspecified type   [facility] Margarita Mail, DO   1 year ago Hypertension, unspecified type   [facility] Margarita Mail, DO       Future Appointments             In 4 months Margarita Mail, DO [facility], [facility]

## 2023-08-04 ENCOUNTER — Other Ambulatory Visit: Payer: Self-pay | Admitting: Internal Medicine

## 2023-08-05 NOTE — Telephone Encounter (Signed)
 Requested Prescriptions  Pending Prescriptions Disp Refills   atorvastatin (LIPITOR) 40 MG tablet [Pharmacy Med Name: ATORVASTATIN TABS 40MG ] 90 tablet 0    Sig: TAKE 1 TABLET DAILY     Cardiovascular:  Antilipid - Statins Failed - 08/05/2023 11:02 AM      Failed - Lipid Panel in normal range within the last 12 months    Cholesterol  Date Value Ref Range Status  08/23/2022 160 <200 mg/dL Final   LDL Cholesterol (Calc)  Date Value Ref Range Status  08/23/2022 85 mg/dL (calc) Final    Comment:    Reference range: <100 . Desirable range <100 mg/dL for primary prevention;   <70 mg/dL for patients with CHD or diabetic patients  with > or = 2 CHD risk factors. Marland Kitchen LDL-C is now calculated using the Martin-Hopkins  calculation, which is a validated novel method providing  better accuracy than the Friedewald equation in the  estimation of LDL-C.  Horald Pollen et al. Lenox Ahr. 8469;629(52): 2061-2068  (http://education.[facility].com/faq/FAQ164)    HDL  Date Value Ref Range Status  08/23/2022 51 > OR = 50 mg/dL Final   Triglycerides  Date Value Ref Range Status  08/23/2022 143 <150 mg/dL Final         Passed - Patient is not pregnant      Passed - Valid encounter within last 12 months    Recent Outpatient Visits           2 months ago Primary hypertension   [facility] Margarita Mail, DO   8 months ago Hypertension, unspecified type   [facility] Margarita Mail, DO   10 months ago History of rheumatoid arthritis   [facility] Margarita Mail, DO   11 months ago Hypertension, unspecified type   [facility] Margarita Mail, DO   1 year ago Hypertension, unspecified type   [facility] Margarita Mail, DO       Future Appointments             In 4 months Margarita Mail, DO [facility], [facility]

## 2023-09-21 DIAGNOSIS — Z23 Encounter for immunization: Secondary | ICD-10-CM | POA: Diagnosis not present

## 2023-11-03 ENCOUNTER — Other Ambulatory Visit: Payer: Self-pay | Admitting: Internal Medicine

## 2023-11-03 DIAGNOSIS — E785 Hyperlipidemia, unspecified: Secondary | ICD-10-CM

## 2023-11-04 NOTE — Telephone Encounter (Signed)
 Requested medications are due for refill today.  yes  Requested medications are on the active medications list.  yes  Last refill. 08/05/2023 #90 0 rf  Future visit scheduled.   yes  Notes to clinic.  Labs are expired.    Requested Prescriptions  Pending Prescriptions Disp Refills   atorvastatin  (LIPITOR) 40 MG tablet [Pharmacy Med Name: ATORVASTATIN  TABS 40MG ] 90 tablet 3    Sig: TAKE 1 TABLET DAILY     Cardiovascular:  Antilipid - Statins Failed - 11/04/2023  9:56 AM      Failed - Lipid Panel in normal range within the last 12 months    Cholesterol  Date Value Ref Range Status  08/23/2022 160 <200 mg/dL Final   LDL Cholesterol (Calc)  Date Value Ref Range Status  08/23/2022 85 mg/dL (calc) Final    Comment:    Reference range: <100 . Desirable range <100 mg/dL for primary prevention;   <70 mg/dL for patients with CHD or diabetic patients  with > or = 2 CHD risk factors. Aaron Aas LDL-C is now calculated using the Martin-Hopkins  calculation, which is a validated novel method providing  better accuracy than the Friedewald equation in the  estimation of LDL-C.  Melinda Sprawls et al. Erroll Heard. 6045;409(81): 2061-2068  (http://education.[facility].com/faq/FAQ164)    HDL  Date Value Ref Range Status  08/23/2022 51 > OR = 50 mg/dL Final   Triglycerides  Date Value Ref Range Status  08/23/2022 143 <150 mg/dL Final         Passed - Patient is not pregnant      Passed - Valid encounter within last 12 months    Recent Outpatient Visits   None     Future Appointments             In 1 month Rockney Cid, DO [facility], [facility]

## 2023-12-05 ENCOUNTER — Encounter: Payer: Self-pay | Admitting: Internal Medicine

## 2023-12-05 ENCOUNTER — Ambulatory Visit (INDEPENDENT_AMBULATORY_CARE_PROVIDER_SITE_OTHER): Payer: Self-pay | Admitting: Internal Medicine

## 2023-12-05 ENCOUNTER — Other Ambulatory Visit: Payer: Self-pay

## 2023-12-05 VITALS — BP 128/72 | HR 100 | Temp 97.7°F | Resp 16 | Ht 66.0 in | Wt 151.0 lb

## 2023-12-05 DIAGNOSIS — E538 Deficiency of other specified B group vitamins: Secondary | ICD-10-CM | POA: Diagnosis not present

## 2023-12-05 DIAGNOSIS — I10 Essential (primary) hypertension: Secondary | ICD-10-CM

## 2023-12-05 DIAGNOSIS — E785 Hyperlipidemia, unspecified: Secondary | ICD-10-CM | POA: Diagnosis not present

## 2023-12-05 DIAGNOSIS — M5432 Sciatica, left side: Secondary | ICD-10-CM

## 2023-12-05 DIAGNOSIS — E559 Vitamin D deficiency, unspecified: Secondary | ICD-10-CM | POA: Diagnosis not present

## 2023-12-05 MED ORDER — NAPROXEN 500 MG PO TABS
500.0000 mg | ORAL_TABLET | Freq: Two times a day (BID) | ORAL | 0 refills | Status: AC
Start: 2023-12-05 — End: 2023-12-12

## 2023-12-05 MED ORDER — HYDROCHLOROTHIAZIDE 12.5 MG PO CAPS: 12.5000 mg | ORAL_CAPSULE | Freq: Every day | ORAL | 1 refills | Status: DC

## 2023-12-05 MED ORDER — TIZANIDINE HCL 4 MG PO TABS
4.0000 mg | ORAL_TABLET | Freq: Every evening | ORAL | 0 refills | Status: DC | PRN
Start: 2023-12-05 — End: 2024-01-09

## 2023-12-05 MED ORDER — AMLODIPINE BESYLATE 5 MG PO TABS: 5.0000 mg | ORAL_TABLET | Freq: Every day | ORAL | 1 refills | Status: DC

## 2023-12-05 MED ORDER — ATORVASTATIN CALCIUM 40 MG PO TABS: 40.0000 mg | ORAL_TABLET | Freq: Every day | ORAL | 1 refills | Status: DC

## 2023-12-05 NOTE — Patient Instructions (Signed)

## 2023-12-05 NOTE — Progress Notes (Signed)
 Established Patient Office Visit  Subjective   Patient ID: Karen Sims, female    DOB: [birth year]  Age: 73 y.o. MRN: [id number]  Chief Complaint  Patient presents with   Medical Management of Chronic Issues    HPI  Karen Sims presents for follow up.   Discussed the use of AI scribe software for clinical note transcription with the patient, who gave verbal consent to proceed.  History of Present Illness Karen Sims is a 73 year old female with sciatica who presents with worsening sciatic nerve pain.  She experiences numbness extending down her leg, causing intense discomfort. The pain can become severe, requiring assistance from her spouse for daily activities such as getting out of bed and into the car. Her sciatic pain is triggered by prolonged sitting or certain motions like sweeping or running a vacuum cleaner. An episode of spreading pine needles in the yard exacerbated her symptoms. Recently, she experienced a severe episode while assisting her spouse with a box, causing shooting pain through her back. Another episode occurred in the garden, requiring assistance with carrying items. The pain is primarily on the left side.  She uses Arnica Montana , a homeopathic remedy, placing five or six pellets under her tongue as needed. She is not currently taking any pain medications other than Tylenol for arthritis, which she takes two in the morning. She avoids steroids.   Hypertension: -Medications: HCTZ 12.5 mg (increased to 25 mg but caused raise in creatinine), Amlodipine  5 mg  -Failed Meds: Losartan  due to rash -Patient is compliant with above medications and reports no side effects. -Checking BP at home (average): 120/80 -Denies any SOB, CP, vision changes, LE edema or symptoms of hypotension.   HLD: -Medications: Lipitor 40 mg -Patient is compliant with above medications and reports no side effects.  -Last lipid panel: Lipid Panel     Component Value Date/Time    CHOL 160 08/23/2022 1038   TRIG 143 08/23/2022 1038   HDL 51 08/23/2022 1038   CHOLHDL 3.1 08/23/2022 1038   LDLCALC 85 08/23/2022 1038    RA: -Faintly positive ANA and RA on labs in May 2024 -No symptoms, not on any medications   Health Maintenance: -Blood work due -Breast cancer screening: Mammogram 10/24 Birads-1 -Colon cancer screening: colonoscopy 2/24, repeat in 10 years -Shingles vaccine done at the pharmacy   Patient Active Problem List   Diagnosis Date Noted   Family history of colon cancer in mother 07/18/2022   Adenomatous polyp of colon 07/18/2022   HTN (hypertension) 06/05/2021   Hyperlipidemia 06/05/2021   RA (rheumatoid arthritis) (HCC) 06/05/2021   Past Medical History:  Diagnosis Date   Hyperlipidemia    Hypertension    RA (rheumatoid arthritis) (HCC)    Past Surgical History:  Procedure Laterality Date   ABDOMINAL HYSTERECTOMY     AUGMENTATION MAMMAPLASTY     2002   COLONOSCOPY WITH PROPOFOL  N/A 07/18/2022   Procedure: COLONOSCOPY WITH PROPOFOL ;  Surgeon: Therisa Bi, MD;  Location: [facility] ENDOSCOPY;  Service: Gastroenterology;  Laterality: N/A;   TUBAL LIGATION     Social History   Tobacco Use   Smoking status: Never   Smokeless tobacco: Never  Vaping Use   Vaping status: Never Used  Substance Use Topics   Alcohol use: Yes    Alcohol/week: 6.0 standard drinks of alcohol    Types: 6 Glasses of wine per week   Drug use: Never   Social History   Socioeconomic History  Marital status: Married    Spouse name: Not on file   Number of children: Not on file   Years of education: Not on file   Highest education level: Some college, no degree  Occupational History   Not on file  Tobacco Use   Smoking status: Never   Smokeless tobacco: Never  Vaping Use   Vaping status: Never Used  Substance and Sexual Activity   Alcohol use: Yes    Alcohol/week: 6.0 standard drinks of alcohol    Types: 6 Glasses of wine per week   Drug use: Never    Sexual activity: Not Currently  Other Topics Concern   Not on file  Social History Narrative   Not on file   Social Drivers of Health   Financial Resource Strain: Low Risk  (12/01/2023)   Overall Financial Resource Strain (CARDIA)    Difficulty of Paying Living Expenses: Not hard at all  Food Insecurity: No Food Insecurity (12/01/2023)   Hunger Vital Sign    Worried About Running Out of Food in the Last Year: Never true    Ran Out of Food in the Last Year: Never true  Transportation Needs: No Transportation Needs (12/01/2023)   PRAPARE - Administrator, Civil Service (Medical): No    Lack of Transportation (Non-Medical): No  Physical Activity: Inactive (12/01/2023)   Exercise Vital Sign    Days of Exercise per Week: 0 days    Minutes of Exercise per Session: Not on file  Stress: No Stress Concern Present (12/01/2023)   [employer] of Occupational Health - Occupational Stress Questionnaire    Feeling of Stress: Not at all  Social Connections: Moderately Isolated (12/01/2023)   Social Connection and Isolation Panel    Frequency of Communication with Friends and Family: More than three times a week    Frequency of Social Gatherings with Friends and Family: Once a week    Attends Religious Services: Never    Database administrator or Organizations: No    Attends Engineer, structural: Not on file    Marital Status: Married  Catering manager Violence: Not At Risk (07/17/2023)   Humiliation, Afraid, Rape, and Kick questionnaire    Fear of Current or Ex-Partner: No    Emotionally Abused: No    Physically Abused: No    Sexually Abused: No   Family Status  Relation Name Status   Mother  Deceased   Father  Deceased   Son 1 Alive   Neg Hx  (Not Specified)  No partnership data on file   Family History  Problem Relation Age of Onset   Cancer Mother        colon   Hypertension Father    Hyperlipidemia Father    Stroke Father    Kidney disease Father    Breast  cancer Neg Hx    Allergies  Allergen Reactions   Aspirin Other (See Comments)    Upset stomach   Codeine Other (See Comments)    Nervous      Review of Systems  All other systems reviewed and are negative.     Objective:     BP 128/72 (Cuff Size: Large)   Pulse 100   Temp 97.7 F (36.5 C) (Oral)   Resp 16   Ht 5' 6 (1.676 m)   Wt 151 lb (68.5 kg)   SpO2 98%   BMI 24.37 kg/m  BP Readings from Last 3 Encounters:  12/05/23 128/72  06/06/23  128/84  11/29/22 120/68   Wt Readings from Last 3 Encounters:  12/05/23 151 lb (68.5 kg)  06/06/23 149 lb 14.4 oz (68 kg)  11/29/22 170 lb 4.8 oz (77.2 kg)      Physical Exam Constitutional:      Appearance: Normal appearance.  HENT:     Head: Normocephalic and atraumatic.  Eyes:     Conjunctiva/sclera: Conjunctivae normal.  Cardiovascular:     Rate and Rhythm: Normal rate and regular rhythm.  Pulmonary:     Effort: Pulmonary effort is normal.     Breath sounds: Normal breath sounds.  Musculoskeletal:     Right lower leg: No edema.     Left lower leg: No edema.  Skin:    General: Skin is warm and dry.  Neurological:     General: No focal deficit present.     Mental Status: She is alert. Mental status is at baseline.  Psychiatric:        Mood and Affect: Mood normal.        Behavior: Behavior normal.      No results found for any visits on 12/05/23.  Last CBC Lab Results  Component Value Date   WBC 6.5 01/10/2022   HGB 12.3 01/10/2022   HCT 36.3 01/10/2022   MCV 91.4 01/10/2022   MCH 31.0 01/10/2022   RDW 12.4 01/10/2022   PLT 266 01/10/2022   Last metabolic panel Lab Results  Component Value Date   GLUCOSE 90 08/23/2022   NA 140 08/23/2022   K 4.8 08/23/2022   CL 101 08/23/2022   CO2 29 08/23/2022   BUN 21 08/23/2022   CREATININE 1.02 (H) 08/23/2022   EGFR 48 (L) 01/10/2022   CALCIUM  9.6 08/23/2022   PROT 7.4 01/10/2022   BILITOT 0.4 01/10/2022   ALKPHOS 87 02/21/2021   AST 20 01/10/2022    ALT 20 01/10/2022   Last lipids Lab Results  Component Value Date   CHOL 160 08/23/2022   HDL 51 08/23/2022   LDLCALC 85 08/23/2022   TRIG 143 08/23/2022   CHOLHDL 3.1 08/23/2022   Last hemoglobin A1c Lab Results  Component Value Date   HGBA1C 5.6 02/21/2021   Last thyroid  functions Lab Results  Component Value Date   TSH 1.47 01/10/2022   Last vitamin D  Lab Results  Component Value Date   VD25OH 138 02/21/2021   Last vitamin B12 and Folate No results found for: VITAMINB12, FOLATE    The 10-year ASCVD risk score (Arnett DK, et al., 2019) is: 26.9%    Assessment & Plan:   Assessment & Plan Sciatica Chronic sciatica with severe pain radiating down the left leg, exacerbated by bending or prolonged standing. Managed with Arnica Montana  and Tylenol for arthritis. Interested in trying a muscle relaxer for symptomatic relief, particularly at night. Emphasized the importance of stretching exercises to relax muscles and alleviate symptoms. - Prescribe muscle relaxer to be taken as needed, especially at bedtime. - Prescribe naproxen  to be taken with food to avoid gastrointestinal upset. - Provide printed stretching exercises. - Send prescriptions to [facility].  Arthritis Chronic arthritis managed with Tylenol, taking two in the morning. Successfully weaned off other medications.  Hypertension Blood pressure stable here today, no changes made to medications and appropriate refills sent to pharmacy. Labs ordered.  Hyperlipidemia Recheck labs, continue statin.   General Health Maintenance Due for lab work today. Blood pressure and cholesterol medications managed through [facility]. - Send blood pressure and cholesterol medication refills to  [facility]. - Perform lab work today as she has fasted.  - CBC w/Diff/Platelet - Comprehensive Metabolic Panel (CMET) - amLODipine  (NORVASC ) 5 MG tablet; Take 1 tablet (5 mg total) by mouth daily.  Dispense: 90  tablet; Refill: 1 - hydrochlorothiazide  (MICROZIDE ) 12.5 MG capsule; Take 1 capsule (12.5 mg total) by mouth daily.  Dispense: 90 capsule; Refill: 1 - Lipid Profile - atorvastatin  (LIPITOR) 40 MG tablet; Take 1 tablet (40 mg total) by mouth daily.  Dispense: 90 tablet; Refill: 1 - tiZANidine  (ZANAFLEX ) 4 MG tablet; Take 1 tablet (4 mg total) by mouth at bedtime as needed.  Dispense: 30 tablet; Refill: 0 - naproxen  (NAPROSYN ) 500 MG tablet; Take 1 tablet (500 mg total) by mouth 2 (two) times daily with a meal for 7 days.  Dispense: 14 tablet; Refill: 0 - Vitamin D  (25 hydroxy) - B12 and Folate Panel   Return in about 6 months (around 06/06/2024).    Sharyle Fischer, DO

## 2023-12-06 LAB — CBC WITH DIFFERENTIAL/PLATELET
Absolute Lymphocytes: 1305 {cells}/uL (ref 850–3900)
Absolute Monocytes: 488 {cells}/uL (ref 200–950)
Basophils Absolute: 67 {cells}/uL (ref 0–200)
Basophils Relative: 1.1 %
Eosinophils Absolute: 128 {cells}/uL (ref 15–500)
Eosinophils Relative: 2.1 %
HCT: 35.1 % (ref 35.0–45.0)
Hemoglobin: 11.4 g/dL — ABNORMAL LOW (ref 11.7–15.5)
MCH: 29.1 pg (ref 27.0–33.0)
MCHC: 32.5 g/dL (ref 32.0–36.0)
MCV: 89.5 fL (ref 80.0–100.0)
MPV: 10.3 fL (ref 7.5–12.5)
Monocytes Relative: 8 %
Neutro Abs: 4111 {cells}/uL (ref 1500–7800)
Neutrophils Relative %: 67.4 %
Platelets: 248 Thousand/uL (ref 140–400)
RBC: 3.92 Million/uL (ref 3.80–5.10)
RDW: 12.2 % (ref 11.0–15.0)
Total Lymphocyte: 21.4 %
WBC: 6.1 Thousand/uL (ref 3.8–10.8)

## 2023-12-06 LAB — COMPREHENSIVE METABOLIC PANEL WITH GFR
AG Ratio: 1.6 (calc) (ref 1.0–2.5)
ALT: 23 U/L (ref 6–29)
AST: 28 U/L (ref 10–35)
Albumin: 4.5 g/dL (ref 3.6–5.1)
Alkaline phosphatase (APISO): 76 U/L (ref 37–153)
BUN/Creatinine Ratio: 11 (calc) (ref 6–22)
BUN: 13 mg/dL (ref 7–25)
CO2: 28 mmol/L (ref 20–32)
Calcium: 9.8 mg/dL (ref 8.6–10.4)
Chloride: 102 mmol/L (ref 98–110)
Creat: 1.18 mg/dL — ABNORMAL HIGH (ref 0.60–1.00)
Globulin: 2.9 g/dL (ref 1.9–3.7)
Glucose, Bld: 91 mg/dL (ref 65–99)
Potassium: 4.2 mmol/L (ref 3.5–5.3)
Sodium: 139 mmol/L (ref 135–146)
Total Bilirubin: 0.4 mg/dL (ref 0.2–1.2)
Total Protein: 7.4 g/dL (ref 6.1–8.1)
eGFR: 49 mL/min/1.73m2 — ABNORMAL LOW (ref >=60)

## 2023-12-06 LAB — LIPID PANEL
Cholesterol: 144 mg/dL (ref <200)
HDL: 53 mg/dL (ref >=50)
LDL Cholesterol (Calc): 73 mg/dL
Non-HDL Cholesterol (Calc): 91 mg/dL (ref <130)
Total CHOL/HDL Ratio: 2.7 (calc) (ref <5.0)
Triglycerides: 99 mg/dL (ref <150)

## 2023-12-06 LAB — B12 AND FOLATE PANEL
Folate: >24 ng/mL
Vitamin B-12: 1629 pg/mL — ABNORMAL HIGH (ref 200–1100)

## 2023-12-06 LAB — VITAMIN D 25 HYDROXY (VIT D DEFICIENCY, FRACTURES): Vit D, 25-Hydroxy: 95 ng/mL (ref 30–100)

## 2023-12-08 ENCOUNTER — Ambulatory Visit: Payer: Self-pay | Admitting: Internal Medicine

## 2024-01-07 ENCOUNTER — Other Ambulatory Visit: Payer: Self-pay | Admitting: Internal Medicine

## 2024-01-07 DIAGNOSIS — M5432 Sciatica, left side: Secondary | ICD-10-CM

## 2024-01-09 NOTE — Telephone Encounter (Signed)
 Requested medications are due for refill today.  yes  Requested medications are on the active medications list.  yes  Last refill. 12/05/2023 #30 0 rf  Future visit scheduled.   yes  Notes to clinic.  Refill not delegated.    Requested Prescriptions  Pending Prescriptions Disp Refills   tiZANidine  (ZANAFLEX ) 4 MG tablet [Pharmacy Med Name: TIZANIDINE  4MG  TABLETS] 30 tablet 0    Sig: TAKE 1 TABLET(4 MG) BY MOUTH AT BEDTIME AS NEEDED     Not Delegated - Cardiovascular:  Alpha-2 Agonists - tizanidine  Failed - 01/09/2024  3:54 PM      Failed - This refill cannot be delegated      Passed - Valid encounter within last 6 months    Recent Outpatient Visits           1 month ago Primary hypertension   [facility] Bernardo Fend, DO       Future Appointments             In 5 months Bernardo Fend, DO [facility], [facility]

## 2024-01-16 ENCOUNTER — Other Ambulatory Visit: Payer: Self-pay | Admitting: Internal Medicine

## 2024-01-16 DIAGNOSIS — Z1231 Encounter for screening mammogram for malignant neoplasm of breast: Secondary | ICD-10-CM

## 2024-03-01 ENCOUNTER — Ambulatory Visit
Admission: RE | Admit: 2024-03-01 | Discharge: 2024-03-01 | Disposition: A | Discharge status: Home / Self Care | Source: Ambulatory Visit | Attending: Internal Medicine | Admitting: Internal Medicine

## 2024-03-01 ENCOUNTER — Other Ambulatory Visit: Payer: Self-pay | Admitting: Internal Medicine

## 2024-03-01 ENCOUNTER — Encounter: Payer: Self-pay | Admitting: Internal Medicine

## 2024-03-01 DIAGNOSIS — Z1231 Encounter for screening mammogram for malignant neoplasm of breast: Secondary | ICD-10-CM | POA: Insufficient documentation

## 2024-03-04 ENCOUNTER — Ambulatory Visit: Payer: Self-pay | Admitting: Internal Medicine

## 2024-03-18 ENCOUNTER — Telehealth: Payer: Self-pay

## 2024-03-18 DIAGNOSIS — Z23 Encounter for immunization: Secondary | ICD-10-CM | POA: Diagnosis not present

## 2024-03-18 NOTE — Telephone Encounter (Signed)
 Would you want pt to get Hep B?   TDAP up to date

## 2024-03-18 NOTE — Telephone Encounter (Signed)
 Copied from CRM #[id number]. Topic: Clinical - Medical Advice >> Mar 18, 2024 12:45 PM [name] wrote: Reason for CRM: Patient went to [facility] this morning and got the flu and Covid vaccine, she was advised by them that she  needs to complete her Hep B and tetanus shot as well. Please review to see if pt is in need of these vaccines, thank you.   -----------------------------------------------------------------------

## 2024-03-22 ENCOUNTER — Other Ambulatory Visit: Payer: Self-pay | Admitting: Internal Medicine

## 2024-03-22 NOTE — Telephone Encounter (Signed)
 Called pt no answer left detailed vm.

## 2024-03-25 DIAGNOSIS — Z23 Encounter for immunization: Secondary | ICD-10-CM | POA: Diagnosis not present

## 2024-04-23 DIAGNOSIS — Z23 Encounter for immunization: Secondary | ICD-10-CM | POA: Diagnosis not present

## 2024-06-11 ENCOUNTER — Encounter: Payer: Self-pay | Admitting: Internal Medicine

## 2024-06-11 ENCOUNTER — Ambulatory Visit: Admitting: Internal Medicine

## 2024-06-11 ENCOUNTER — Other Ambulatory Visit: Payer: Self-pay

## 2024-06-11 VITALS — BP 118/72 | HR 66 | Temp 97.9°F | Resp 16 | Ht 66.0 in | Wt 178.2 lb

## 2024-06-11 DIAGNOSIS — N1831 Chronic kidney disease, stage 3a: Secondary | ICD-10-CM | POA: Diagnosis not present

## 2024-06-11 DIAGNOSIS — E785 Hyperlipidemia, unspecified: Secondary | ICD-10-CM | POA: Diagnosis not present

## 2024-06-11 DIAGNOSIS — I10 Essential (primary) hypertension: Secondary | ICD-10-CM

## 2024-06-11 MED ORDER — LOSARTAN POTASSIUM 25 MG PO TABS: 25.0000 mg | ORAL_TABLET | Freq: Every day | ORAL | 1 refills | Status: AC

## 2024-06-11 MED ORDER — AMLODIPINE BESYLATE 5 MG PO TABS: 5.0000 mg | ORAL_TABLET | Freq: Every day | ORAL | 1 refills | Status: AC

## 2024-06-11 MED ORDER — ATORVASTATIN CALCIUM 40 MG PO TABS: 40.0000 mg | ORAL_TABLET | Freq: Every day | ORAL | 1 refills | Status: AC

## 2024-06-11 MED ORDER — HYDROCHLOROTHIAZIDE 12.5 MG PO CAPS: 12.5000 mg | ORAL_CAPSULE | Freq: Every day | ORAL | 1 refills | Status: AC

## 2024-06-11 NOTE — Progress Notes (Signed)
 "  Established Patient Office Visit  Subjective   Patient ID: Karen Sims, female    DOB: [birth year]  Age: 74 y.o. MRN: [id number]  Chief Complaint  Patient presents with   Medical Management of Chronic Issues    6 month recheck    HPI  Karen Sims presents for follow up.   Discussed the use of AI scribe software for clinical note transcription with the patient, who gave verbal consent to proceed.  History of Present Illness  Karen Sims is a 74 year old female with chronic kidney disease who presents for a follow-up visit.  Her most recent labs showed a GFR of 49, consistent with chronic kidney disease. She takes amlodipine  and hydrochlorothiazide  for blood pressure. She has a family history of kidney disease, and her father had long-standing hypertension and died from kidney disease.  Her blood pressure was 118/72 this morning, which she notes is better than usual for her. Lisinopril was stopped in the past due to a dry cough.  Her routine vaccines are up to date, including flu, pneumonia, tetanus, and COVID, and she is completing a hepatitis vaccine series. She had a normal mammogram in October and a colonoscopy two years ago.  She is retired and enjoys gardening as a regular activity.   Hypertension: -Medications: HCTZ 12.5 mg (increased to 25 mg but caused raise in creatinine), Amlodipine  5 mg  -Failed Meds: Lisinopril due to cough -Patient is compliant with above medications and reports no side effects. -Checking BP at home (average): 120/80 -Denies any SOB, CP, vision changes, LE edema or symptoms of hypotension.   HLD: -Medications: Lipitor 40 mg -Patient is compliant with above medications and reports no side effects.  -Last lipid panel: Lipid Panel     Component Value Date/Time   CHOL 144 12/05/2023 1003   TRIG 99 12/05/2023 1003   HDL 53 12/05/2023 1003   CHOLHDL 2.7 12/05/2023 1003   LDLCALC 73 12/05/2023 1003    RA: -Faintly positive ANA  and RA on labs in May 2024 -No symptoms, not on any medications   Health Maintenance: -Blood work UTD -Breast cancer screening: Mammogram 10/25 Birads-1 -Colon cancer screening: colonoscopy 2/24, repeat in 10 years -Shingles vaccine done at the pharmacy   Patient Active Problem List   Diagnosis Date Noted   Family history of colon cancer in mother 07/18/2022   Adenomatous polyp of colon 07/18/2022   HTN (hypertension) 06/05/2021   Hyperlipidemia 06/05/2021   RA (rheumatoid arthritis) (HCC) 06/05/2021   Past Medical History:  Diagnosis Date   Hyperlipidemia    Hypertension    RA (rheumatoid arthritis) (HCC)    Past Surgical History:  Procedure Laterality Date   ABDOMINAL HYSTERECTOMY     AUGMENTATION MAMMAPLASTY     2002   COLONOSCOPY WITH PROPOFOL  N/A 07/18/2022   Procedure: COLONOSCOPY WITH PROPOFOL ;  Surgeon: Therisa Bi, MD;  Location: [facility] ENDOSCOPY;  Service: Gastroenterology;  Laterality: N/A;   TUBAL LIGATION     Social History   Tobacco Use   Smoking status: Never   Smokeless tobacco: Never  Vaping Use   Vaping status: Never Used  Substance Use Topics   Alcohol use: Yes    Alcohol/week: 6.0 standard drinks of alcohol    Types: 6 Glasses of wine per week   Drug use: Never   Social History   Socioeconomic History   Marital status: Married    Spouse name: Not on file   Number of children: Not on  file   Years of education: Not on file   Highest education level: Some college, no degree  Occupational History   Not on file  Tobacco Use   Smoking status: Never   Smokeless tobacco: Never  Vaping Use   Vaping status: Never Used  Substance and Sexual Activity   Alcohol use: Yes    Alcohol/week: 6.0 standard drinks of alcohol    Types: 6 Glasses of wine per week   Drug use: Never   Sexual activity: Not Currently  Other Topics Concern   Not on file  Social History Narrative   Not on file   Social Drivers of Health   Tobacco Use: Low Risk (06/11/2024)    Patient History    Smoking Tobacco Use: Never    Smokeless Tobacco Use: Never    Passive Exposure: Not on file  Financial Resource Strain: Low Risk (06/07/2024)   Overall Financial Resource Strain (CARDIA)    Difficulty of Paying Living Expenses: Not hard at all  Food Insecurity: No Food Insecurity (06/07/2024)   Epic    Worried About Programme Researcher, Broadcasting/film/video in the Last Year: Never true    Ran Out of Food in the Last Year: Never true  Transportation Needs: No Transportation Needs (06/07/2024)   Epic    Lack of Transportation (Medical): No    Lack of Transportation (Non-Medical): No  Physical Activity: Inactive (06/07/2024)   Exercise Vital Sign    Days of Exercise per Week: 0 days    Minutes of Exercise per Session: Not on file  Stress: No Stress Concern Present (06/07/2024)   [employer] of Occupational Health - Occupational Stress Questionnaire    Feeling of Stress: Not at all  Social Connections: Moderately Isolated (06/07/2024)   Social Connection and Isolation Panel    Frequency of Communication with Friends and Family: More than three times a week    Frequency of Social Gatherings with Friends and Family: Never    Attends Religious Services: Never    Database Administrator or Organizations: No    Attends Engineer, Structural: Not on file    Marital Status: Married  Catering Manager Violence: Not At Risk (07/17/2023)   Humiliation, Afraid, Rape, and Kick questionnaire    Fear of Current or Ex-Partner: No    Emotionally Abused: No    Physically Abused: No    Sexually Abused: No  Depression (PHQ2-9): Low Risk (06/11/2024)   Depression (PHQ2-9)    PHQ-2 Score: 2  Alcohol Screen: Low Risk (07/17/2023)   Alcohol Screen    Last Alcohol Screening Score (AUDIT): 0  Housing: Low Risk (06/07/2024)   Epic    Unable to Pay for Housing in the Last Year: No    Number of Times Moved in the Last Year: 0    Homeless in the Last Year: No  Utilities: Not At Risk (07/17/2023)    AHC Utilities    Threatened with loss of utilities: No  Health Literacy: Adequate Health Literacy (07/17/2023)   B1300 Health Literacy    Frequency of need for help with medical instructions: Never   Family Status  Relation Name Status   Mother  Deceased   Father  Deceased   Son 1 Alive   Neg Hx  (Not Specified)  No partnership data on file   Family History  Problem Relation Age of Onset   Cancer Mother        colon   Hypertension Father    Hyperlipidemia Father  Stroke Father    Kidney disease Father    Breast cancer Neg Hx    Allergies  Allergen Reactions   Aspirin Other (See Comments)    Upset stomach   Codeine Other (See Comments)    Nervous      Review of Systems  All other systems reviewed and are negative.     Objective:     BP 118/72   Pulse 66   Temp 97.9 F (36.6 C) (Oral)   Resp 16   Ht 5' 6 (1.676 m)   Wt 178 lb 3.2 oz (80.8 kg)   SpO2 97%   BMI 28.76 kg/m  BP Readings from Last 3 Encounters:  06/11/24 118/72  12/05/23 128/72  06/06/23 128/84   Wt Readings from Last 3 Encounters:  06/11/24 178 lb 3.2 oz (80.8 kg)  12/05/23 151 lb (68.5 kg)  06/06/23 149 lb 14.4 oz (68 kg)      Physical Exam Constitutional:      Appearance: Normal appearance.  HENT:     Head: Normocephalic and atraumatic.     Mouth/Throat:     Mouth: Mucous membranes are moist.     Pharynx: Oropharynx is clear.  Eyes:     Extraocular Movements: Extraocular movements intact.     Conjunctiva/sclera: Conjunctivae normal.     Pupils: Pupils are equal, round, and reactive to light.  Cardiovascular:     Rate and Rhythm: Normal rate and regular rhythm.  Pulmonary:     Effort: Pulmonary effort is normal.     Breath sounds: Normal breath sounds.  Musculoskeletal:     Right lower leg: No edema.     Left lower leg: No edema.  Skin:    General: Skin is warm and dry.  Neurological:     General: No focal deficit present.     Mental Status: She is alert. Mental  status is at baseline.  Psychiatric:        Mood and Affect: Mood normal.        Behavior: Behavior normal.      No results found for any visits on 06/11/24.  Last CBC Lab Results  Component Value Date   WBC 6.1 12/05/2023   HGB 11.4 (L) 12/05/2023   HCT 35.1 12/05/2023   MCV 89.5 12/05/2023   MCH 29.1 12/05/2023   RDW 12.2 12/05/2023   PLT 248 12/05/2023   Last metabolic panel Lab Results  Component Value Date   GLUCOSE 91 12/05/2023   NA 139 12/05/2023   K 4.2 12/05/2023   CL 102 12/05/2023   CO2 28 12/05/2023   BUN 13 12/05/2023   CREATININE 1.18 (H) 12/05/2023   EGFR 49 (L) 12/05/2023   CALCIUM  9.8 12/05/2023   PROT 7.4 12/05/2023   BILITOT 0.4 12/05/2023   ALKPHOS 87 02/21/2021   AST 28 12/05/2023   ALT 23 12/05/2023   Last lipids Lab Results  Component Value Date   CHOL 144 12/05/2023   HDL 53 12/05/2023   LDLCALC 73 12/05/2023   TRIG 99 12/05/2023   CHOLHDL 2.7 12/05/2023   Last hemoglobin A1c Lab Results  Component Value Date   HGBA1C 5.6 02/21/2021   Last thyroid  functions Lab Results  Component Value Date   TSH 1.47 01/10/2022   Last vitamin D  Lab Results  Component Value Date   VD25OH 95 12/05/2023   Last vitamin B12 and Folate Lab Results  Component Value Date   VITAMINB12 1,629 (H) 12/05/2023   FOLATE >24.0 12/05/2023  The 10-year ASCVD risk score (Arnett DK, et al., 2019) is: 14%    Assessment & Plan:   Assessment & Plan  Chronic kidney disease, stage 3a Chronic kidney disease stage 3a with GFR of 49. Hemoglobin slightly decreased. Hydrochlorothiazide  may worsen kidney function, but dose is low. Lisinopril avoided due to cough history. Losartan  chosen for kidney protection with lower cough risk. - Prescribed losartan  25 mg daily, option to halve if hypotensive. - Monitor blood pressure regularly. - Recheck labs in summer. - Continue hydrochlorothiazide , amlodipine , Lipitor.  Primary hypertension Blood pressure  well-controlled at 118/72. Losartan  added for kidney protection and potential blood pressure control. - Monitor blood pressure regularly. - Refilled amlodipine  and hydrochlorothiazide .  Hyperlipidemia Cholesterol levels well-controlled. - Continue Lipitor.  - losartan  (COZAAR ) 25 MG tablet; Take 1 tablet (25 mg total) by mouth daily.  Dispense: 90 tablet; Refill: 1 - amLODipine  (NORVASC ) 5 MG tablet; Take 1 tablet (5 mg total) by mouth daily.  Dispense: 90 tablet; Refill: 1 - hydrochlorothiazide  (MICROZIDE ) 12.5 MG capsule; Take 1 capsule (12.5 mg total) by mouth daily.  Dispense: 90 capsule; Refill: 1 - atorvastatin  (LIPITOR) 40 MG tablet; Take 1 tablet (40 mg total) by mouth daily.  Dispense: 90 tablet; Refill: 1   Return in about 6 months (around 12/09/2024).   Sharyle Fischer, DO "

## 2024-07-22 ENCOUNTER — Ambulatory Visit: Payer: Medicare Other

## 2024-12-10 ENCOUNTER — Ambulatory Visit: Admitting: Internal Medicine
